# Patient Record
Sex: Male | Born: 1950 | Race: White | Hispanic: No | Marital: Married | Smoking: Current every day smoker
Health system: Southern US, Community
[De-identification: ages and names within clinical notes are randomized; demographics above are authoritative.]

## PROBLEM LIST (undated history)

## (undated) DIAGNOSIS — N200 Calculus of kidney: Secondary | ICD-10-CM

## (undated) DIAGNOSIS — K56609 Unspecified intestinal obstruction, unspecified as to partial versus complete obstruction: Secondary | ICD-10-CM

## (undated) DIAGNOSIS — K219 Gastro-esophageal reflux disease without esophagitis: Secondary | ICD-10-CM

## (undated) DIAGNOSIS — R519 Headache, unspecified: Secondary | ICD-10-CM

## (undated) DIAGNOSIS — R51 Headache: Secondary | ICD-10-CM

## (undated) DIAGNOSIS — M1990 Unspecified osteoarthritis, unspecified site: Secondary | ICD-10-CM

## (undated) DIAGNOSIS — R0600 Dyspnea, unspecified: Secondary | ICD-10-CM

## (undated) DIAGNOSIS — J449 Chronic obstructive pulmonary disease, unspecified: Secondary | ICD-10-CM

## (undated) DIAGNOSIS — J189 Pneumonia, unspecified organism: Secondary | ICD-10-CM

## (undated) DIAGNOSIS — I10 Essential (primary) hypertension: Secondary | ICD-10-CM

## (undated) DIAGNOSIS — G8929 Other chronic pain: Secondary | ICD-10-CM

## (undated) HISTORY — DX: Gastro-esophageal reflux disease without esophagitis: K21.9

## (undated) HISTORY — PX: BACK SURGERY: SHX140

## (undated) HISTORY — DX: Calculus of kidney: N20.0

## (undated) HISTORY — DX: Headache, unspecified: R51.9

## (undated) HISTORY — DX: Other chronic pain: G89.29

## (undated) HISTORY — DX: Essential (primary) hypertension: I10

## (undated) HISTORY — DX: Unspecified osteoarthritis, unspecified site: M19.90

## (undated) HISTORY — DX: Pneumonia, unspecified organism: J18.9

## (undated) HISTORY — PX: ROTATOR CUFF REPAIR: SHX139

## (undated) HISTORY — DX: Unspecified intestinal obstruction, unspecified as to partial versus complete obstruction: K56.609

## (undated) HISTORY — DX: Headache: R51

## (undated) HISTORY — PX: KNEE SURGERY: SHX244

## (undated) HISTORY — PX: OTHER SURGICAL HISTORY: SHX169

---

## 1989-05-23 HISTORY — PX: OTHER SURGICAL HISTORY: SHX169

## 2004-05-23 HISTORY — PX: HERNIA REPAIR: SHX51

## 2016-09-20 HISTORY — PX: APPENDECTOMY: SHX54

## 2017-06-08 ENCOUNTER — Encounter: Payer: Self-pay | Admitting: Internal Medicine

## 2017-06-08 ENCOUNTER — Ambulatory Visit: Payer: Medicare Other | Admitting: Internal Medicine

## 2017-06-08 DIAGNOSIS — F17210 Nicotine dependence, cigarettes, uncomplicated: Secondary | ICD-10-CM | POA: Diagnosis not present

## 2017-06-08 DIAGNOSIS — R0789 Other chest pain: Secondary | ICD-10-CM

## 2017-06-08 DIAGNOSIS — R0600 Dyspnea, unspecified: Secondary | ICD-10-CM | POA: Insufficient documentation

## 2017-06-08 DIAGNOSIS — J449 Chronic obstructive pulmonary disease, unspecified: Secondary | ICD-10-CM | POA: Diagnosis not present

## 2017-06-08 DIAGNOSIS — R0609 Other forms of dyspnea: Secondary | ICD-10-CM | POA: Diagnosis not present

## 2017-06-08 MED ORDER — TIOTROPIUM BROMIDE-OLODATEROL 2.5-2.5 MCG/ACT IN AERS: 2.0000 | INHALATION_SPRAY | Freq: Every day | RESPIRATORY_TRACT | 11 refills | Status: DC

## 2017-06-08 MED ORDER — TIOTROPIUM BROMIDE-OLODATEROL 2.5-2.5 MCG/ACT IN AERS: 2.0000 | INHALATION_SPRAY | Freq: Every day | RESPIRATORY_TRACT | 0 refills | Status: DC

## 2017-06-08 NOTE — Patient Instructions (Addendum)
Stiolto 2 pffs each am   Treatment consists of avoiding foods that cause gas (especially boiled eggs, mexcican food but especially  beans and undercooked vegetables like  spinach and some salads)  and citrucel 1 heaping tsp twice daily with a large glass of water.  Pain should improve w/in 2 weeks and if not then consider further GI work up.     GERD (REFLUX)  is an extremely common cause of respiratory symptoms just like yours , many times with no obvious heartburn at all.    It can be treated with medication, but also with lifestyle changes including elevation of the head of your bed (ideally with 6 inch  bed blocks),  Smoking cessation, avoidance of late meals, excessive alcohol, and avoid fatty foods, chocolate, peppermint, colas, red wine, and acidic juices such as orange juice.  NO MINT OR MENTHOL PRODUCTS SO NO COUGH DROPS  USE SUGARLESS CANDY INSTEAD (Jolley ranchers or Stover's or Life Savers) or even ice chips will also do - the key is to swallow to prevent all throat clearing. NO OIL BASED VITAMINS - use powdered substitutes.    Zostrix cream four times daily but especially at bedtime   The key is to stop smoking completely before smoking completely stops you!   Please schedule a follow up office visit in 4 weeks, sooner if needed with pfts  - needs alpha one screen on return
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
## 2017-06-09 ENCOUNTER — Encounter: Payer: Self-pay | Admitting: Internal Medicine

## 2017-06-09 DIAGNOSIS — R0789 Other chest pain: Secondary | ICD-10-CM | POA: Insufficient documentation

## 2017-06-09 DIAGNOSIS — F17210 Nicotine dependence, cigarettes, uncomplicated: Secondary | ICD-10-CM | POA: Insufficient documentation

## 2017-06-09 NOTE — Assessment & Plan Note (Signed)
Chronic longstanding refractory LUQ pain c/w IBS with post thoracotomy pain on L as well   rec diet/ zostrix/ citrucel trial  - see avs for instructions unique to this ov

## 2017-06-09 NOTE — Assessment & Plan Note (Signed)
06/08/2017  Walked RA x 3 laps @ 185 ft each stopped due to  End of study, fast pace, no desat   / min sob   See copd

## 2017-06-09 NOTE — Assessment & Plan Note (Signed)
Spirometry 06/08/2017  FEV1 1.43 (36%)  Ratio 41 with classic curvature   - 06/08/2017  After extensive coaching inhaler device  effectiveness =    90% try stiolto sample   DDX of  difficult airways management almost all start with A and  include Adherence, Ace Inhibitors, Acid Reflux, Active Sinus Disease, Alpha 1 Antitripsin deficiency, Anxiety masquerading as Airways dz,  ABPA,  Allergy(esp in young), Aspiration (esp in elderly), Adverse effects of meds,  Active smokers, A bunch of PE's (a small clot burden can't cause this syndrome unless there is already severe underlying pulm or vascular dz with poor reserve) plus two Bs  = Bronchiectasis and Beta blocker use..and one C= CHF  Adherence is always the initial "prime suspect" and is a multilayered concern that requires a "trust but verify" approach in every patient - starting with knowing how to use medications, especially inhalers, correctly, keeping up with refills and understanding the fundamental difference between maintenance and prns vs those medications only taken for a very short course and then stopped and not refilled.  - see hfa teaching - return with all meds in hand using a trust but verify approach to confirm accurate Medication  Reconciliation The principal here is that until we are certain that the  patients are doing what we've asked, it makes no sense to ask them to do more.   Active smoking  (see separate a/p)   ? Anxiety > usually at the bottom of this list of usual suspects but should be much higher on this pt's based on H and P based on multiple refractory chronic pain locations and reported doe x 5 ft not reproducible here but anxiety can certainly add to air trapping if breathing rate goes up excessively and this may be happening here.   ? Alpha one def > check at next ov   Total time devoted to counseling  > 50 % of initial 60 min office visit:  review case with pt/ discussion of options/alternatives/ personally creating  written customized instructions  in presence of pt  then going over those specific  Instructions directly with the pt including how to use all of the meds but in particular covering each new medication in detail and the difference between the maintenance= "automatic" meds and the prns using an action plan format for the latter (If this problem/symptom => do that organization reading Left to right).  Please see AVS from this visit for a full list of these instructions which I personally wrote for this pt and  are unique to this visit.

## 2017-06-09 NOTE — Assessment & Plan Note (Signed)

## 2017-06-28 ENCOUNTER — Encounter: Payer: Self-pay | Admitting: Internal Medicine

## 2017-06-28 ENCOUNTER — Ambulatory Visit: Payer: Medicare Other | Admitting: Internal Medicine

## 2017-06-28 VITALS — BP 136/88 | HR 79 | Temp 97.9°F | Ht 74.0 in | Wt 188.0 lb

## 2017-06-28 DIAGNOSIS — R058 Other specified cough: Secondary | ICD-10-CM

## 2017-06-28 DIAGNOSIS — F17210 Nicotine dependence, cigarettes, uncomplicated: Secondary | ICD-10-CM | POA: Diagnosis not present

## 2017-06-28 DIAGNOSIS — J449 Chronic obstructive pulmonary disease, unspecified: Secondary | ICD-10-CM | POA: Diagnosis not present

## 2017-06-28 DIAGNOSIS — R05 Cough: Secondary | ICD-10-CM | POA: Diagnosis not present

## 2017-06-28 MED ORDER — FAMOTIDINE 20 MG PO TABS
ORAL_TABLET | ORAL | 2 refills | Status: DC
Start: 2017-06-28 — End: 2017-09-26

## 2017-06-28 MED ORDER — PREDNISONE 10 MG PO TABS: ORAL_TABLET | ORAL | 0 refills | Status: DC

## 2017-06-28 MED ORDER — PANTOPRAZOLE SODIUM 40 MG PO TBEC: 40.0000 mg | DELAYED_RELEASE_TABLET | Freq: Every day | ORAL | 2 refills | Status: DC

## 2017-06-28 MED ORDER — TRAMADOL HCL 50 MG PO TABS: ORAL_TABLET | ORAL | 0 refills | Status: DC

## 2017-06-28 MED ORDER — MOMETASONE FURO-FORMOTEROL FUM 200-5 MCG/ACT IN AERO: 2.0000 | INHALATION_SPRAY | Freq: Two times a day (BID) | RESPIRATORY_TRACT | 0 refills | Status: DC
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
## 2017-06-28 NOTE — Patient Instructions (Addendum)
Pantoprazole (protonix) 40 mg   Take  30-60 min before first meal of the day and Pepcid (famotidine)  20 mg one hour before    bedtime until return to office - this is the best way to tell whether stomach acid is contributing to your problem.     GERD (REFLUX)  is an extremely common cause of respiratory symptoms just like yours , many times with no obvious heartburn at all.    It can be treated with medication, but also with lifestyle changes including elevation of the head of your bed (ideally with 6 inch  bed blocks),  Smoking cessation, avoidance of late meals, excessive alcohol, and avoid fatty foods, chocolate, peppermint, colas, red wine, and acidic juices such as orange juice.  NO MINT OR MENTHOL PRODUCTS SO NO COUGH DROPS   USE SUGARLESS CANDY INSTEAD (Jolley ranchers or Stover's or Life Savers) or even ice chips will also do - the key is to swallow to prevent all throat clearing. NO OIL BASED VITAMINS - use powdered substitutes.    Prednisone 10 mg 2 daily until better then 1 daily until seen     Best cough medication > mucinex dm 1200 mg every 12 hours and supplement tramadol 50 mg every 4 hours as needed    Keep your appt for next week  - late add duler 200 Take 2 puffs first thing in am and then another 2 puffs about 12 hours later.  - alpha one screen next ov

## 2017-06-29 ENCOUNTER — Encounter: Payer: Self-pay | Admitting: Internal Medicine

## 2017-06-29 DIAGNOSIS — R05 Cough: Secondary | ICD-10-CM | POA: Insufficient documentation

## 2017-06-29 DIAGNOSIS — R058 Other specified cough: Secondary | ICD-10-CM | POA: Insufficient documentation

## 2017-06-29 NOTE — Assessment & Plan Note (Addendum)
Of the three most common causes of  Sub-acute or recurrent or chronic cough, only one (GERD)  can actually contribute to/ trigger  the other two (asthma and post nasal drip syndrome)  and perpetuate the cylce of cough.  While not intuitively obvious, many patients with chronic low grade reflux do not cough until there is a primary insult that disturbs the protective epithelial barrier and exposes sensitive nerve endings.   This is typically viral but can be direct physical injury such as with an endotracheal tube.     The point is that once this occurs, it is difficult to eliminate the cycle  using anything but a maximally effective acid suppression regimen at least in the short run, accompanied by an appropriate diet to address non acid GERD and control / eliminate the cough itself for at least 3 days with tramadol. See avs for instructions unique to this ov

## 2017-06-29 NOTE — Assessment & Plan Note (Signed)
>   3 min Discussed the risks and costs (both direct and indirect)  of smoking relative to the benefits of quitting but patient unwilling to commit at this point to a specific quit date.       

## 2017-06-29 NOTE — Assessment & Plan Note (Signed)
Spirometry 06/08/2017  FEV1 1.43 (36%)  Ratio 41 with classic curvature   - 06/08/2017   stiolto sample > did not tol due to muscle cramps in neck and shoulders - 06/28/2017  After extensive coaching inhaler device  effectiveness =    90% > try dulera 200 2bid and pred 20 mg daily until better then 10 mg daily   Severe copd and still smoking is a challenging combination - see sep a/p   Will try dulera 200 and restart pred as much worse since stopped. The goal with a chronic steroid dependent illness is always arriving at the lowest effective dose that controls the disease/symptoms and not accepting a set "formula" which is based on statistics or guidelines that don't always take into account patient  variability or the natural hx of the dz in every individual patient, which may well vary over time.  For now therefore I recommend the patient maintain  20 mg as ceiling and 10 mg as floor for now   I had an extended discussion with the patient reviewing all relevant studies completed to date and  lasting 15 to 20 minutes of a 25 minute acute office visit    Each maintenance medication was reviewed in detail including most importantly the difference between maintenance and prns and under what circumstances the prns are to be triggered using an action plan format that is not reflected in the computer generated alphabetically organized AVS.    Please see AVS for specific instructions unique to this visit that I personally wrote and verbalized to the the pt in detail and then reviewed with pt  by my nurse highlighting any  changes in therapy recommended at today's visit to their plan of care.

## 2017-07-07 ENCOUNTER — Ambulatory Visit (INDEPENDENT_AMBULATORY_CARE_PROVIDER_SITE_OTHER): Payer: Medicare Other | Admitting: Internal Medicine

## 2017-07-07 ENCOUNTER — Other Ambulatory Visit (INDEPENDENT_AMBULATORY_CARE_PROVIDER_SITE_OTHER): Payer: Medicare Other

## 2017-07-07 ENCOUNTER — Telehealth: Payer: Self-pay | Admitting: Internal Medicine

## 2017-07-07 ENCOUNTER — Ambulatory Visit: Payer: Medicare Other | Admitting: Internal Medicine

## 2017-07-07 ENCOUNTER — Encounter: Payer: Self-pay | Admitting: Internal Medicine

## 2017-07-07 VITALS — BP 128/88 | HR 85 | Ht 73.0 in | Wt 188.0 lb

## 2017-07-07 DIAGNOSIS — J449 Chronic obstructive pulmonary disease, unspecified: Secondary | ICD-10-CM | POA: Diagnosis not present

## 2017-07-07 DIAGNOSIS — F17210 Nicotine dependence, cigarettes, uncomplicated: Secondary | ICD-10-CM

## 2017-07-07 LAB — PULMONARY FUNCTION TEST
DL/VA % pred: 37 %
DL/VA: 1.78 ml/min/mmHg/L
DLCO UNC % PRED: 34 %
DLCO unc: 12.46 ml/min/mmHg
FEF 25-75 PRE: 0.62 L/s
FEF 25-75 Post: 0.68 L/sec
FEF2575-%CHANGE-POST: 9 %
FEF2575-%PRED-PRE: 20 %
FEF2575-%Pred-Post: 22 %
FEV1-%Change-Post: 5 %
FEV1-%Pred-Post: 44 %
FEV1-%Pred-Pre: 42 %
FEV1-POST: 1.7 L
FEV1-Pre: 1.61 L
FEV1FVC-%Change-Post: 2 %
FEV1FVC-%Pred-Pre: 54 %
FEV6-%CHANGE-POST: 4 %
FEV6-%PRED-PRE: 76 %
FEV6-%Pred-Post: 79 %
FEV6-POST: 3.85 L
FEV6-PRE: 3.7 L
FEV6FVC-%CHANGE-POST: 1 %
FEV6FVC-%PRED-PRE: 99 %
FEV6FVC-%Pred-Post: 100 %
FVC-%CHANGE-POST: 2 %
FVC-%Pred-Post: 78 %
FVC-%Pred-Pre: 76 %
FVC-Post: 4.03 L
FVC-Pre: 3.92 L
POST FEV6/FVC RATIO: 96 %
PRE FEV1/FVC RATIO: 41 %
Post FEV1/FVC ratio: 42 %
Pre FEV6/FVC Ratio: 94 %
RV % PRED: 277 %
RV: 7 L
TLC % PRED: 152 %
TLC: 11.61 L

## 2017-07-07 LAB — CBC WITH DIFFERENTIAL/PLATELET
BASOS ABS: 0.1 10*3/uL (ref 0.0–0.1)
Basophils Relative: 0.4 % (ref 0.0–3.0)
Eosinophils Absolute: 0 10*3/uL (ref 0.0–0.7)
Eosinophils Relative: 0.3 % (ref 0.0–5.0)
HCT: 46.2 % (ref 39.0–52.0)
HEMOGLOBIN: 15 g/dL (ref 13.0–17.0)
LYMPHS ABS: 2 10*3/uL (ref 0.7–4.0)
Lymphocytes Relative: 12.4 % (ref 12.0–46.0)
MCHC: 32.5 g/dL (ref 30.0–36.0)
MONO ABS: 0.7 10*3/uL (ref 0.1–1.0)
MONOS PCT: 4.3 % (ref 3.0–12.0)
NEUTROS PCT: 82.6 % — AB (ref 43.0–77.0)
Neutro Abs: 13.1 10*3/uL — ABNORMAL HIGH (ref 1.4–7.7)
Platelets: 498 10*3/uL — ABNORMAL HIGH (ref 150.0–400.0)
RBC: 6.87 Mil/uL — AB (ref 4.22–5.81)
RDW: 16 % — ABNORMAL HIGH (ref 11.5–15.5)
WBC: 15.9 10*3/uL — AB (ref 4.0–10.5)

## 2017-07-07 LAB — BASIC METABOLIC PANEL
BUN: 13 mg/dL (ref 6–23)
CO2: 26 meq/L (ref 19–32)
Calcium: 9.1 mg/dL (ref 8.4–10.5)
Chloride: 104 mEq/L (ref 96–112)
Creatinine, Ser: 0.82 mg/dL (ref 0.40–1.50)
GFR: 100.05 mL/min (ref >60.00)
Glucose, Bld: 113 mg/dL — ABNORMAL HIGH (ref 70–99)
Potassium: 4.2 mEq/L (ref 3.5–5.1)
SODIUM: 139 meq/L (ref 135–145)

## 2017-07-07 MED ORDER — ALBUTEROL SULFATE HFA 108 (90 BASE) MCG/ACT IN AERS: INHALATION_SPRAY | RESPIRATORY_TRACT | 1 refills | Status: DC

## 2017-07-07 MED ORDER — MOMETASONE FURO-FORMOTEROL FUM 200-5 MCG/ACT IN AERO: 2.0000 | INHALATION_SPRAY | Freq: Two times a day (BID) | RESPIRATORY_TRACT | 11 refills | Status: DC

## 2017-07-07 MED ORDER — MOMETASONE FURO-FORMOTEROL FUM 200-5 MCG/ACT IN AERO: 2.0000 | INHALATION_SPRAY | Freq: Two times a day (BID) | RESPIRATORY_TRACT | 0 refills | Status: DC

## 2017-07-07 NOTE — Telephone Encounter (Signed)
Patient had questions about rescue inhaler. Questions answered. Nothing further needed.
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
## 2017-07-07 NOTE — Progress Notes (Signed)
PFT done today. 

## 2017-07-07 NOTE — Patient Instructions (Addendum)
Prednisone 10 mg one half daily  X  A week then one half even days for a week then stop - resume the previous dose worse   Continue dulera 200  Take 2 puffs first thing in am and then another 2 puffs about 12 hours later.   Only use your albuterol (proair)  as a rescue medication to be used if you can't catch your breath by resting or doing a relaxed purse lip breathing pattern.  - The less you use it, the better it will work when you need it. - Ok to use up to 2 puffs  every 4 hours if you must but call for immediate appointment if use goes up over your usual need - Don't leave home without it !!  (think of it like the spare tire for your car)   Please remember to go to the lab department downstairs in the basement  for your tests - we will call you with the results when they are available.      Please schedule a follow up office visit in 6 weeks, call sooner if needed

## 2017-07-08 ENCOUNTER — Encounter: Payer: Self-pay | Admitting: Internal Medicine

## 2017-07-08 NOTE — Assessment & Plan Note (Signed)
>   3 min  I took an extended  opportunity with this patient to outline the consequences of continued cigarette use  in airway disorders based on all the data we have from the multiple national lung health studies (perfomed over decades at millions of dollars in cost)  indicating that smoking cessation, not choice of inhalers or physicians, is the most important aspect of his care.  Not willing to set quit date, wife also active smoker so these are definitely challenges.

## 2017-07-08 NOTE — Assessment & Plan Note (Signed)
Spirometry 06/08/2017  FEV1 1.43 (36%)  Ratio 41 with classic curvature   - 06/08/2017   stiolto sample > did not tol due to muscle cramps in neck and shoulders - 06/28/2017  After extensive coaching inhaler device  effectiveness =    90% > try dulera 200 2bid and pred 20 mg daily until better then 10 mg daily > much better 07/07/2017   - PFT's  07/07/2017  FEV1 1.70 (44 % ) ratio 42  p 5 % improvement from saba p dulera 200 prior to study with DLCO  34/37 % corrects to 92  % for alv volume   - 07/07/2017 taper off pred x 2 weeks   Despite active smoking he's improved on dulera where did not improve on stiolto so rec continue dulera 200 2bid and taper pred  The goal with a chronic steroid dependent illness is always arriving at the lowest effective dose that controls the disease/symptoms and not accepting a set "formula" which is based on statistics or guidelines that don't always take into account patient  variability or the natural hx of the dz in every individual patient, which may well vary over time.  For now therefore I recommend the patient taper off pred x 2 weeks but if flares go back to previous dose that worked   I had an extended discussion with the patient and wife reviewing all relevant studies completed to date and  lasting 15 to 20 minutes of a 25 minute visit    Each maintenance medication was reviewed in detail including most importantly the difference between maintenance and prns and under what circumstances the prns are to be triggered using an action plan format that is not reflected in the computer generated alphabetically organized AVS.    Please see AVS for specific instructions unique to this visit that I personally wrote and verbalized to the the pt in detail and then reviewed with pt  by my nurse highlighting any  changes in therapy recommended at today's visit to their plan of care.

## 2017-07-12 ENCOUNTER — Telehealth: Payer: Self-pay | Admitting: Internal Medicine

## 2017-07-12 LAB — ALPHA-1 ANTITRYPSIN PHENOTYPE: A1 ANTITRYPSIN SER: 98 mg/dL (ref 83–199)

## 2017-07-12 NOTE — Telephone Encounter (Signed)
Called and spoke with pt letting him know results of his labwork. Pt expressed understanding. Nothing further needed at this current time.

## 2017-07-12 NOTE — Progress Notes (Signed)
LMTCB

## 2017-08-24 ENCOUNTER — Ambulatory Visit: Payer: Medicare Other | Admitting: Internal Medicine

## 2017-08-24 ENCOUNTER — Encounter: Payer: Self-pay | Admitting: Internal Medicine

## 2017-08-24 VITALS — BP 136/78 | HR 80 | Ht 73.0 in | Wt 189.0 lb

## 2017-08-24 DIAGNOSIS — J449 Chronic obstructive pulmonary disease, unspecified: Secondary | ICD-10-CM

## 2017-08-24 DIAGNOSIS — F17210 Nicotine dependence, cigarettes, uncomplicated: Secondary | ICD-10-CM

## 2017-08-24 MED ORDER — TIOTROPIUM BROMIDE MONOHYDRATE 2.5 MCG/ACT IN AERS: INHALATION_SPRAY | RESPIRATORY_TRACT | 11 refills | Status: DC

## 2017-08-24 MED ORDER — PREDNISONE 10 MG PO TABS: ORAL_TABLET | ORAL | 0 refills | Status: DC

## 2017-08-24 NOTE — Patient Instructions (Addendum)
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
## 2017-08-28 ENCOUNTER — Encounter: Payer: Self-pay | Admitting: Internal Medicine

## 2017-08-28 NOTE — Assessment & Plan Note (Signed)

## 2017-08-28 NOTE — Assessment & Plan Note (Signed)
Spirometry 06/08/2017  FEV1 1.43 (36%)  Ratio 41 with classic curvature   - 06/08/2017   stiolto sample > did not tol due to muscle cramps in neck and shoulders - 06/28/2017  After extensive coaching inhaler device  effectiveness =    90% > try dulera 200 2bid and pred 20 mg daily until better then 10 mg daily > much better 07/07/2017  - PFT's  07/07/2017  FEV1 1.70 (44 % ) ratio 42  p 5 % improvement from saba p dulera 200 prior to study with DLCO  34/37 % corrects to 92  % for alv volume   - 07/07/2017 taper off pred x 2 weeks  - alpha one screening 07/07/2017  >  MZ level 98   - 08/24/2017  After extensive coaching inhaler device  effectiveness =    90% with SMI > try add spiriva 2 respimat each am    I had an extended discussion with the patient reviewing all relevant studies completed to date and  lasting 15 to 20 minutes of a 25 minute visit on the following ongoing concerns:   1) reviewed MZ and why he should never smoke again and his sibling should be checked as well as his children  2)   Group D in terms of symptom/risk and laba/lama/ICS  therefore appropriate rx at this point. So Added spriva trial and pred taper to off if tol  3)  Each maintenance medication was reviewed in detail including most importantly the difference between maintenance and as needed and under what circumstances the prns are to be used.  Please see AVS for specific  Instructions which are unique to this visit and I personally typed out  which were reviewed in detail in writing with the patient and a copy provided.

## 2017-09-26 ENCOUNTER — Other Ambulatory Visit: Payer: Self-pay | Admitting: Internal Medicine

## 2017-10-06 ENCOUNTER — Ambulatory Visit: Payer: Medicare Other | Admitting: Internal Medicine

## 2017-10-09 ENCOUNTER — Ambulatory Visit: Payer: Medicare Other | Admitting: Internal Medicine

## 2017-10-09 ENCOUNTER — Encounter: Payer: Self-pay | Admitting: Internal Medicine

## 2017-10-09 VITALS — BP 140/86 | HR 83 | Ht 73.0 in | Wt 187.4 lb

## 2017-10-09 DIAGNOSIS — F17210 Nicotine dependence, cigarettes, uncomplicated: Secondary | ICD-10-CM

## 2017-10-09 DIAGNOSIS — R059 Cough, unspecified: Secondary | ICD-10-CM

## 2017-10-09 DIAGNOSIS — J449 Chronic obstructive pulmonary disease, unspecified: Secondary | ICD-10-CM | POA: Diagnosis not present

## 2017-10-09 DIAGNOSIS — R05 Cough: Secondary | ICD-10-CM | POA: Diagnosis not present

## 2017-10-09 DIAGNOSIS — R058 Other specified cough: Secondary | ICD-10-CM

## 2017-10-09 MED ORDER — MOMETASONE FURO-FORMOTEROL FUM 200-5 MCG/ACT IN AERO: 2.0000 | INHALATION_SPRAY | Freq: Two times a day (BID) | RESPIRATORY_TRACT | 0 refills | Status: DC

## 2017-10-09 MED ORDER — TRAMADOL HCL 50 MG PO TABS: ORAL_TABLET | ORAL | 0 refills | Status: DC
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
## 2017-10-09 NOTE — Patient Instructions (Addendum)
Take delsym two tsp every 12 hours and supplement if needed with  tramadol 50 mg up to 2 every 4 hours to suppress the urge to cough. Swallowing water and/or using ice chips/non mint and menthol containing candies (such as lifesavers or sugarless jolly ranchers) are also effective.  You should rest your voice and avoid activities that you know make you cough.  Once you have eliminated the cough for 3 straight days try reducing the tramadol first,  then the delsym as tolerated.    Prednisone 10 mg take 2 daily with breakfast until completely better (no cough or need for tramadol or albuterol)  then 1 daily x 5 days x one half tablet daily   Please see patient coordinator before you leave today  to schedule sinus CT   Continue dulera 200 Take 2 puffs first thing in am and then another 2 puffs about 12 hours later.   Keep appt to see me on 10/17/17 and be sure to bring all medications/ inhalers

## 2017-10-10 ENCOUNTER — Encounter: Payer: Self-pay | Admitting: Internal Medicine

## 2017-10-10 NOTE — Assessment & Plan Note (Addendum)
Spirometry 06/08/2017  FEV1 1.43 (36%)  Ratio 41 with classic curvature   - 06/08/2017   stiolto sample > did not tol due to muscle cramps in neck and shoulders - 06/28/2017  After extensive coaching inhaler device  effectiveness =    90% > try dulera 200 2bid and pred 20 mg daily until better then 10 mg daily > much better 07/07/2017  - PFT's  07/07/2017  FEV1 1.70 (44 % ) ratio 42  p 5 % improvement from saba p dulera 200 prior to study with DLCO  34/37 % corrects to 92  % for alv volume   - 07/07/2017 taper off pred x 2 weeks  - alpha one screening 07/07/2017  >  MZ level 98   - 08/24/2017  After extensive coaching inhaler device  effectiveness =    90% with SMI > try add spiriva 2 respimat each am    10/09/2017  After extensive coaching inhaler device  effectiveness =    90% > continue dulera hfa   Despite active smoking I believe this problem is actually unchanged and cough is now the main issue and may not be related to copd (see separate a/p)   No change in maint rx needed

## 2017-10-10 NOTE — Assessment & Plan Note (Signed)
I emphasized that although we never turn away smokers from the pulmonary clinic, we do ask that they understand that the recommendations that we make  won't work nearly as well in the presence of continued cigarette exposure.  In fact, we may very well  reach a point where we can't promise to help the patient if he/she can't quit smoking. (We can and will promise to try to help, we just can't promise what we recommend will really work)  

## 2017-10-10 NOTE — Assessment & Plan Note (Addendum)
Sinus CT ordered   DDX of  difficult airways management almost all start with A and  include Adherence, Ace Inhibitors, Acid Reflux, Active Sinus Disease, Alpha 1 Antitripsin deficiency, Anxiety masquerading as Airways dz,  ABPA,  Allergy(esp in young), Aspiration (esp in elderly), Adverse effects of meds,  Active smokers, A bunch of PE's (a small clot burden can't cause this syndrome unless there is already severe underlying pulm or vascular dz with poor reserve) plus two Bs  = Bronchiectasis and Beta blocker use..and one C= CHF   Adherence is always the initial "prime suspect" and is a multilayered concern that requires a "trust but verify" approach in every patient - starting with knowing how to use medications, especially inhalers, correctly, keeping up with refills and understanding the fundamental difference between maintenance and prns vs those medications only taken for a very short course and then stopped and not refilled.  - see hfa teaching - return with all meds in hand using a trust but verify approach to confirm accurate Medication  Reconciliation The principal here is that until we are certain that the  patients are doing what we've asked, it makes no sense to ask them to do more.   Active smoking > top of the usual list of suspects (see separate a/p)    ? Acid (or non-acid) GERD > always difficult to exclude as up to 75% of pts in some series report no assoc GI/ Heartburn symptoms> rec continue max (24h)  acid suppression and diet restrictions/ reviewed    - Of the three most common causes of  Sub-acute / recurrent or chronic cough, only one (GERD)  can actually contribute to/ trigger  the other two (asthma and post nasal drip syndrome)  and perpetuate the cylce of cough.  While not intuitively obvious, many patients with chronic low grade reflux do not cough until there is a primary insult that disturbs the protective epithelial barrier and exposes sensitive nerve endings.   This is  typically viral but can due to PNDS and  either may apply here.   The point is that once this occurs, it is difficult to eliminate the cycle  using anything but a maximally effective acid suppression regimen at least in the short run, accompanied by an appropriate diet to address non acid GERD and control / eliminate the cough itself for at least 3 days with tramadol    ? Allergy component > continue dulera 200 and add prednisone back and titrate down but not off this time to 5 mg floor.   ? Active sinus Dz > check sinus CT  ? A bunch of PE's > excluded by CTa 5/20/ 19    I had an extended discussion with the patient reviewing all relevant studies completed to date and  lasting 25 minutes of a 40  minute extended office  visit addressing severe non-specific but potentially very serious refractory respiratory symptoms of uncertain and potentially multiple  Etiologies.   See device teaching / smoking counseling which extended face to face time for this visit   Each maintenance medication was reviewed in detail including most importantly the difference between maintenance and prns and under what circumstances the prns are to be triggered using an action plan format that is not reflected in the computer generated alphabetically organized AVS.    Please see AVS for specific instructions unique to this office visit that I personally wrote and verbalized to the the pt in detail and then reviewed with pt  by my nurse highlighting any changes in therapy/plan of care  recommended at today's visit.

## 2017-10-12 ENCOUNTER — Telehealth: Payer: Self-pay | Admitting: Internal Medicine

## 2017-10-12 NOTE — Telephone Encounter (Signed)
PA initiated on [email or web address] today.  Key: [id number] for Dulera 200 It takes 7-10 business days for decision of approval or denial.

## 2017-10-17 ENCOUNTER — Ambulatory Visit (INDEPENDENT_AMBULATORY_CARE_PROVIDER_SITE_OTHER)
Admission: RE | Admit: 2017-10-17 | Discharge: 2017-10-17 | Disposition: A | Discharge status: Home / Self Care | Payer: Medicare Other | Source: Ambulatory Visit | Attending: Internal Medicine | Admitting: Internal Medicine

## 2017-10-17 ENCOUNTER — Encounter: Payer: Self-pay | Admitting: Internal Medicine

## 2017-10-17 ENCOUNTER — Ambulatory Visit: Payer: Medicare Other | Admitting: Internal Medicine

## 2017-10-17 VITALS — BP 152/76 | HR 65 | Ht 74.0 in | Wt 187.4 lb

## 2017-10-17 DIAGNOSIS — R058 Other specified cough: Secondary | ICD-10-CM

## 2017-10-17 DIAGNOSIS — J449 Chronic obstructive pulmonary disease, unspecified: Secondary | ICD-10-CM

## 2017-10-17 DIAGNOSIS — R059 Cough, unspecified: Secondary | ICD-10-CM

## 2017-10-17 DIAGNOSIS — R05 Cough: Secondary | ICD-10-CM

## 2017-10-17 MED ORDER — AMOXICILLIN-POT CLAVULANATE 875-125 MG PO TABS: 1.0000 | ORAL_TABLET | Freq: Two times a day (BID) | ORAL | 0 refills | Status: DC

## 2017-10-17 MED ORDER — AMOXICILLIN-POT CLAVULANATE 875-125 MG PO TABS: ORAL_TABLET | ORAL | 0 refills | Status: DC
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
## 2017-10-17 NOTE — Patient Instructions (Addendum)
Prednisone 10 mg 2 each until 100% better then 1 daily one week then one-half daily until return and if flare go back one step    Augmentin 875 mg take one pill twice daily  X 20 days - take at breakfast and supper with large glass of water.  It would help reduce the usual side effects (diarrhea and yeast infections) if you ate cultured yogurt at lunch.    Please see patient coordinator before you leave today  to schedule sinus CT in 21 days   Please schedule a follow up visit in 3 weeks  but call sooner if needed  with all medications /inhalers/ solutions in hand so we can verify exactly what you are taking. This includes all medications from all doctors and over the counters

## 2017-10-18 ENCOUNTER — Encounter: Payer: Self-pay | Admitting: Internal Medicine

## 2017-10-18 NOTE — Assessment & Plan Note (Addendum)
Spirometry 06/08/2017  FEV1 1.43 (36%)  Ratio 41 with classic curvature   - 06/08/2017   stiolto sample > did not tol due to muscle cramps in neck and shoulders - 06/28/2017  After extensive coaching inhaler device  effectiveness =    90% > try dulera 200 2bid and pred 20 mg daily until better then 10 mg daily > much better 07/07/2017  - PFT's  07/07/2017  FEV1 1.70 (44 % ) ratio 42  p 5 % improvement from saba p dulera 200 prior to study with DLCO  34/37 % corrects to 92  % for alv volume   - 07/07/2017 taper off pred x 2 weeks  - alpha one screening 07/07/2017  >  MZ level 98   - 08/24/2017  After extensive coaching inhaler device  effectiveness =    90% with SMI > try add spiriva 2 respimat each am   Improved but still on prednisone.  The goal with a chronic steroid dependent illness is always arriving at the lowest effective dose that controls the disease/symptoms and not accepting a set "formula" which is based on statistics or guidelines that don't always take into account patient  variability or the natural hx of the dz in every individual patient, which may well vary over time.  For now therefore I recommend the patient maintain  A floor of 5 mg per day for now and a ceiling of 20 mg daily    - The proper method of use, as well as anticipated side effects, of a metered-dose inhaler are discussed and demonstrated to the patient.     I had an extended discussion with the patient reviewing all relevant studies completed to date and  lasting 15 to 20 minutes of a 25 minute visit    See device teaching which extended face to face time for this visit.  Each maintenance medication was reviewed in detail including emphasizing most importantly the difference between maintenance and prns and under what circumstances the prns are to be triggered using an action plan format that is not reflected in the computer generated alphabetically organized AVS which I have not found useful in most complex patients,  especially with respiratory illnesses  Please see AVS for specific instructions unique to this visit that I personally wrote and verbalized to the the pt in detail and then reviewed with pt  by my nurse highlighting any  changes in therapy recommended at today's visit to their plan of care.

## 2017-10-18 NOTE — Assessment & Plan Note (Signed)
Sinus CT 10/17/2017 >>>  Acute L max sinusitis > augmentin x 20 days then ov 21 days with sinus CT   If truly has quit smoking then this is the main culprit driving his unstable symptoms so rx x 20 days and f/u with sinus ct to see if ent eval needed.

## 2017-10-25 ENCOUNTER — Telehealth: Payer: Self-pay | Admitting: Internal Medicine

## 2017-10-25 NOTE — Telephone Encounter (Signed)
Called and spoke with Pharmacists.  Pharmacy just wanted [place] to know that they sent over a PA for Patients Dulera.

## 2017-11-02 ENCOUNTER — Other Ambulatory Visit: Payer: Self-pay | Admitting: Internal Medicine

## 2017-11-02 MED ORDER — TRAMADOL HCL 50 MG PO TABS: ORAL_TABLET | ORAL | 0 refills | Status: DC

## 2017-11-13 ENCOUNTER — Ambulatory Visit (INDEPENDENT_AMBULATORY_CARE_PROVIDER_SITE_OTHER)
Admission: RE | Admit: 2017-11-13 | Discharge: 2017-11-13 | Disposition: A | Discharge status: Home / Self Care | Payer: Medicare Other | Source: Ambulatory Visit | Attending: Internal Medicine | Admitting: Internal Medicine

## 2017-11-13 ENCOUNTER — Encounter: Payer: Self-pay | Admitting: Internal Medicine

## 2017-11-13 ENCOUNTER — Ambulatory Visit: Payer: Medicare Other | Admitting: Internal Medicine

## 2017-11-13 VITALS — BP 144/80 | HR 90 | Ht 74.0 in | Wt 189.0 lb

## 2017-11-13 DIAGNOSIS — J449 Chronic obstructive pulmonary disease, unspecified: Secondary | ICD-10-CM

## 2017-11-13 DIAGNOSIS — R05 Cough: Secondary | ICD-10-CM | POA: Diagnosis not present

## 2017-11-13 DIAGNOSIS — F17210 Nicotine dependence, cigarettes, uncomplicated: Secondary | ICD-10-CM | POA: Diagnosis not present

## 2017-11-13 DIAGNOSIS — R058 Other specified cough: Secondary | ICD-10-CM

## 2017-11-13 DIAGNOSIS — R059 Cough, unspecified: Secondary | ICD-10-CM

## 2017-11-13 MED ORDER — TIOTROPIUM BROMIDE-OLODATEROL 2.5-2.5 MCG/ACT IN AERS: 2.0000 | INHALATION_SPRAY | Freq: Every day | RESPIRATORY_TRACT | 11 refills | Status: DC

## 2017-11-13 MED ORDER — GABAPENTIN 100 MG PO CAPS: 100.0000 mg | ORAL_CAPSULE | Freq: Three times a day (TID) | ORAL | 2 refills | Status: DC
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
## 2017-11-13 NOTE — Patient Instructions (Addendum)
Gabapentin 100 mg three times a day - called in already   Drop to  prednisone 10 mg daily   When you use up spiriva, stop dulera and spiriva and start stiolto two puffs each am   For drainage / throat tickle try take CHLORPHENIRAMINE  4 mg - take one every 4 hours as needed - available over the counter- may cause drowsiness so start with just a bedtime dose or two and see how you tolerate it before trying in daytime     Please remember to go to the  x-ray department downstairs in the basement  for your tests - we will call you with the results when they are available.      Please schedule a follow up office visit in 6 weeks, call sooner if needed

## 2017-11-14 ENCOUNTER — Encounter: Payer: Self-pay | Admitting: Internal Medicine

## 2017-11-14 NOTE — Assessment & Plan Note (Addendum)
Sinus CT 10/17/2017 >>>  Acute L max sinusitis > augmentin x 20 days then ov 21 days with sinus CT > resolved but still coughing - gabapentin 100  Mg tid trial  Plus prn  1st gen H1 blockers per guidelines    Upper airway cough syndrome (previously labeled PNDS),  is so named because it's frequently impossible to sort out how much is  CR/sinusitis with freq throat clearing (which can be related to primary GERD)   vs  causing  secondary (" extra esophageal")  GERD from wide swings in gastric pressure that occur with throat clearing, often  promoting self use of mint and menthol lozenges that reduce the lower esophageal sphincter tone and exacerbate the problem further in a cyclical fashion.   These are the same pts (now being labeled as having "irritable larynx syndrome" by some cough centers) who not infrequently have a history of having failed to tolerate ace inhibitors,  dry powder inhalers or biphosphonates or report having atypical/extraesophageal reflux symptoms that don't respond to standard doses of PPI  and are easily confused as having aecopd or asthma flares by even experienced allergists/ pulmonologists (myself included).   rec trial of gabapentin/chlorpheniramine  and if not effective referral to ent/ [facility].

## 2017-11-14 NOTE — Assessment & Plan Note (Addendum)
Spirometry 06/08/2017  FEV1 1.43 (36%)  Ratio 41 with classic curvature   - 06/08/2017   stiolto sample > did not tol due to muscle cramps in neck and shoulders - 06/28/2017  After extensive coaching inhaler device  effectiveness =    90% > try dulera 200 2bid and pred 20 mg daily until better then 10 mg daily > much better 07/07/2017  - PFT's  07/07/2017  FEV1 1.70 (44 % ) ratio 42  p 5 % improvement from saba p dulera 200 prior to study with DLCO  34/37 % corrects to 92  % for alv volume   - 07/07/2017 taper off pred x 2 weeks  - alpha one screening 07/07/2017  >  MZ level 98   - 08/24/2017  After extensive coaching inhaler device  effectiveness =    90% with SMI > try add spiriva 2 respimat each am   - 11/13/2017 rec change to stiolto from dulera/spiriva due to cost since already getting steroids systemically and can't afford the dulera (ICS) at this point  Improved overall x for the upper airway cough so rec prednisone taper to 10 mg / day and focus on stopping smoking (see separate a/p)

## 2017-11-15 ENCOUNTER — Telehealth: Payer: Self-pay | Admitting: Internal Medicine

## 2017-11-15 NOTE — Telephone Encounter (Signed)
Notes recorded by Nyoka Cowden, MD on 11/14/2017 at 7:49 AM EDT Call pt: Reviewed cxr and no acute change so no change in recommendations made at ov ----------------------------------- Spoke with pt. He is aware of results. Nothing further was needed.

## 2017-11-15 NOTE — Progress Notes (Signed)
Left detailed msg on machine ok per DPR

## 2017-12-07 ENCOUNTER — Encounter: Payer: Self-pay | Admitting: Gastroenterology

## 2017-12-25 ENCOUNTER — Encounter: Payer: Self-pay | Admitting: Internal Medicine

## 2017-12-25 ENCOUNTER — Ambulatory Visit: Payer: Medicare Other | Admitting: Internal Medicine

## 2017-12-25 VITALS — BP 146/84 | HR 98 | Ht 74.0 in | Wt 194.0 lb

## 2017-12-25 DIAGNOSIS — J449 Chronic obstructive pulmonary disease, unspecified: Secondary | ICD-10-CM | POA: Diagnosis not present

## 2017-12-25 DIAGNOSIS — F17210 Nicotine dependence, cigarettes, uncomplicated: Secondary | ICD-10-CM | POA: Diagnosis not present

## 2017-12-25 MED ORDER — PREDNISONE 10 MG PO TABS: ORAL_TABLET | ORAL | 0 refills | Status: DC
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
## 2017-12-25 NOTE — Patient Instructions (Addendum)
Prednisone 10 mg 2 each until 100% better then 1 daily one week then one-half daily until return and if flare go back one step    Please schedule a follow up office visit in 6 weeks, call sooner if needed with all medications /inhalers/ solutions in hand so we can verify exactly what you are taking. This includes all medications from all doctors and over the counters  - consider HRCT looking for bronchiectasis/repeat sinus ct  on return

## 2017-12-28 ENCOUNTER — Encounter: Payer: Self-pay | Admitting: Internal Medicine

## 2017-12-28 NOTE — Assessment & Plan Note (Signed)
4-5 min discussion re active cigarette smoking in addition to office E&M  Ask about tobacco use:   ongoing Advise quitting   I took an extended  opportunity with this patient to outline the consequences of continued cigarette use  in airway disorders based on all the data we have from the multiple national lung health studies (perfomed over decades at millions of dollars in cost)  indicating that smoking cessation, not choice of inhalers or physicians, is the most important aspect of care.   Assess willingness:  Not committed at this point Assist in quit attempt:  Per PCP when ready Arrange follow up:   Follow up per Primary Care planned        

## 2017-12-28 NOTE — Assessment & Plan Note (Signed)

## 2017-12-28 NOTE — Assessment & Plan Note (Signed)
Spirometry 06/08/2017  FEV1 1.43 (36%)  Ratio 41 with classic curvature   - 06/08/2017   stiolto sample > did not tol due to muscle cramps in neck and shoulders - 06/28/2017  After extensive coaching inhaler device  effectiveness =    90% > try dulera 200 2bid and pred 20 mg daily until better then 10 mg daily > much better 07/07/2017  - PFT's  07/07/2017  FEV1 1.70 (44 % ) ratio 42  p 5 % improvement from saba p dulera 200 prior to study with DLCO  34/37 % corrects to 92  % for alv volume   - 07/07/2017 taper off pred x 2 weeks  - alpha one screening 07/07/2017  >  MZ level 98   - 08/24/2017  After extensive coaching inhaler device  effectiveness =    90% with SMI > try add spiriva 2 respimat each am - 10/09/17 placed on daily prednisone    - 12/25/2017  After extensive coaching inhaler device  effectiveness =    90%   DDX of  difficult airways management almost all start with A and  include Adherence, Ace Inhibitors, Acid Reflux, Active Sinus Disease, Alpha 1 Antitripsin deficiency, Anxiety masquerading as Airways dz,  ABPA,  Allergy(esp in young), Aspiration (esp in elderly), Adverse effects of meds,  Active smokers, A bunch of PE's (a small clot burden can't cause this syndrome unless there is already severe underlying pulm or vascular dz with poor reserve) plus two Bs  = Bronchiectasis and Beta blocker use..and one C= CHF   Adherence is always the initial "prime suspect" and is a multilayered concern that requires a "trust but verify" approach in every patient - starting with knowing how to use medications, especially inhalers, correctly, keeping up with refills and understanding the fundamental difference between maintenance and prns vs those medications only taken for a very short course and then stopped and not refilled.  - see device teaching - return with all meds in hand using a trust but verify approach to confirm accurate Medication  Reconciliation The principal here is that until we are certain  that the  patients are doing what we've asked, it makes no sense to ask them to do more.   Active smoking biggest concern - see sep a/p  ? Allergy/asthma component > The goal with a chronic steroid dependent illness is always arriving at the lowest effective dose that controls the disease/symptoms and not accepting a set "formula" which is based on statistics or guidelines that don't always take into account patient  variability or the natural hx of the dz in every individual patient, which may well vary over time.  For now therefore I recommend the patient maintain  20 mg /day ceiling and taper to 5 mg per day floor  Active sinus dz > resolved as of 11/13/17 but may need repeat   ? Alpha One def > discussed again with him active smoking makes him look really like a ZZ in terms of balance between protease/ antiproteases but he is in no way a candidate for replacement if he is still smoking   ? Anxiety/depression > usually at the bottom of this list of usual suspects but should be much higher on this pt's based on H and P and may interfere with adherence and also interpretation of response or lack thereof to symptom management which can be quite subjective.   ? Bronchiectasis > consider hrct chest next ov     I had  an extended discussion with the patient reviewing all relevant studies completed to date and  lasting 15 to 20 minutes of a 25 minute visit    See device teaching which extended face to face time for this visit.  Each maintenance medication was reviewed in detail including emphasizing most importantly the difference between maintenance and prns and under what circumstances the prns are to be triggered using an action plan format that is not reflected in the computer generated alphabetically organized AVS which I have not found useful in most complex patients, especially with respiratory illnesses  Please see AVS for specific instructions unique to this visit that I personally wrote and  verbalized to the the pt in detail and then reviewed with pt  by my nurse highlighting any  changes in therapy recommended at today's visit to their plan of care.

## 2018-01-04 ENCOUNTER — Encounter: Payer: Self-pay | Admitting: Gastroenterology

## 2018-01-04 ENCOUNTER — Telehealth: Payer: Self-pay | Admitting: Gastroenterology

## 2018-01-04 ENCOUNTER — Ambulatory Visit (INDEPENDENT_AMBULATORY_CARE_PROVIDER_SITE_OTHER): Payer: Medicare Other | Admitting: Gastroenterology

## 2018-01-04 VITALS — BP 130/90 | HR 87 | Ht 74.0 in | Wt 192.4 lb

## 2018-01-04 DIAGNOSIS — R109 Unspecified abdominal pain: Secondary | ICD-10-CM

## 2018-01-04 DIAGNOSIS — R100 Acute abdomen: Secondary | ICD-10-CM

## 2018-01-04 NOTE — Telephone Encounter (Signed)
Patient wife calling requesting a referral to Dr.Adam Daily at [facility] in [place]. Phone #[phone] for pain management.

## 2018-01-04 NOTE — Telephone Encounter (Signed)
I spoke with his wife and they want Dr Daily to evaluate the MSK pain at the left 10th rib.  I did send a copy of the office note from today.

## 2018-01-04 NOTE — Progress Notes (Signed)
Chief Complaint: LUQ pain   Referring Provider:   Dr. Sandrea Hughs, MD      HPI:     Robert Pugh is a 67 y.o. male referred to the [facility]ology Clinic for evaluation of chronic LUQ pain. He has had an extensive evaluation for this in the past, as summarized below. He states that his sxs essentially started after admission in 1991 for ?emypyema with left decortication, described as intense left sided pain surrounding his rib cage. Sxs have essentially persisted, and over the last 10 years or so describes exacerbation of pain with eating large amounts of food, with some associated SOB when pain intensifies. Type of food is not a/w worsening/alleviated sxs. Pain also exacerbated by supine. He has trialed dietary modifications to include low fiber foods along with acid suppression therapy, without any clinical improvement. He follows with Dr Sherene Sires in Pulmonology.   Overall, sxs are unchanged for many years, and presenting today for eval and potential treatment. He is otherwise without nausea, vomiting, diarrhea, constipation, hematochezia, melena. He had Ex lap with LOA for SBO in Jan 2018 without any clinical improvement in chronic sxs.   Available records reviewed and evaluation to date notable for: 2018: January: Ex-lap with LOA for SBO per patient in [place] May: Appendectomy for acute appendicitis per patient in [place]  2014: [facility] (Dr. Channing Mutters): Left T10, T11, T12 intercostal block 02/2013, reporting initial 100% improvement at time of f/u. Recommend observation and if recurrence, plan for either repeat injection or intercostal ablative procedure [facility] ([facility] [place]): MRI abd/pelvis normal with referral to Pain Management  Prior to 2014, had an extensive evaluation in [place] by Internal Medicine, Genral Surgery, GI, and Neurology, which was essentially unrevealing for etiology, including: Colonoscopy x2 (2010, 2011), EGD (2012), VCE (?), CT, serologic  eval, ex lap       Past Medical History:  Diagnosis Date  . Arthritis   . Bowel obstruction (HCC)   . Chronic headaches   . GERD (gastroesophageal reflux disease)   . High blood pressure   . Kidney stone   . Pneumonia      Past Surgical History:  Procedure Laterality Date  . APPENDECTOMY  09/2016  . HERNIA REPAIR Right 2006  . KNEE SURGERY Left   . Left shoulder repair     . plural effusion  1991  . ROTATOR CUFF REPAIR Right    Family History  Problem Relation Age of Onset  . Diabetes Mother   . Multiple sclerosis Mother   . Heart disease Father   . Congestive Heart Failure Father   . Esophageal cancer Neg Hx   . Colon cancer Neg Hx   . Pancreatic cancer Neg Hx   . Prostate cancer Neg Hx    Social History   Tobacco Use  . Smoking status: Current Every Day Smoker    Packs/day: 0.25    Years: 40.00    Pack years: 10.00    Types: Cigarettes  . Smokeless tobacco: Never Used  Substance Use Topics  . Alcohol use: Not Currently  . Drug use: Not on file   Current Outpatient Medications  Medication Sig Dispense Refill  . albuterol (PROAIR HFA) 108 (90 Base) MCG/ACT inhaler 2 puffs every 4 hours as needed only  if your can't catch your breath 1 Inhaler 1  . cetirizine (ZYRTEC) 10 MG tablet Take 10 mg by mouth daily.    Marland Kitchen  ondansetron (ZOFRAN) 8 MG tablet Take 8 mg by mouth as needed for nausea or vomiting.    . predniSONE (DELTASONE) 10 MG tablet Take  2 each am until better then 1 daily x 5 days, then one half daily  And then stop 100 tablet 0  . Tiotropium Bromide-Olodaterol (STIOLTO RESPIMAT) 2.5-2.5 MCG/ACT AERS Inhale 2 puffs into the lungs daily. 1 Inhaler 11  . VERAPAMIL HCL PO Take 120 mg by mouth daily.     No current facility-administered medications for this visit.    Allergies  Allergen Reactions  . Gabapentin     Arms and legs jerking, vision changes     Review of Systems: All systems reviewed and negative except where noted in HPI.      Physical Exam:    Wt Readings from Last 3 Encounters:  01/04/18 192 lb 6 oz (87.3 kg)  12/25/17 194 lb (88 kg)  11/13/17 189 lb (85.7 kg)    BP 130/90   Pulse 87   Ht 6\' 2"  (1.88 m)   Wt 192 lb 6 oz (87.3 kg)   BMI 24.70 kg/m  Constitutional:  Pleasant, in no acute distress. Psychiatric: Normal mood and affect. Behavior is normal. EENT: Pupils normal.  Conjunctivae are normal. No scleral icterus. Neck supple. No cervical LAD. Cardiovascular: Normal rate, regular rhythm. No edema Pulmonary/chest: Effort normal and breath sounds normal. No wheezing, rales or rhonchi. Abdominal: Soft, nondistended. Bowel sounds active throughout. There are no masses palpable. No hepatomegaly. +Carnett's sign located adjacent to left costal margin. Has TTP along left 10th rib line, tracing back to prior well healed incision from prior thoracotomy. Otherwise, well healed midline incision and lap port sites.  Neurological: Alert and oriented to person place and time. Skin: Skin is warm and dry. No rashes noted.   ASSESSMENT AND PLAN;   Robert Pugh is a 67 y.o. male presenting with chronic LUQ pain which is actually located along the left costal margin, most notably along the left 10th rib, with a positive Carnett's sign, all indicative of MSK etiology for presenting sxs. Aside from exacerbation of sxs with large food boluses, he remains largely without GI sxs and has had an extensive unrevealing GI evaluation as outlined above. I do not recommend any further endoscopic eval or abdominal imaging at this time, but instead recommend return to Pain Management. He states that he currently sees a Pain Management specialist for back pain, but has never brought up his costal pain to him. I recommend that he do so and discuss tx options given his comfort with this physician. If no tx can be done there for this issue, then will recommend returning to Pain Management at [facility] (previously [facility] by [facility]) for  discussion of either repeat T10, T11, T12 intercostal block vs intercostal ablative procedure vs new modality. As for the etiology, while we discussed post herpetic neuralgia given seemingly isolated dermatome, no hx of vesicles and the very chronic nature and non-response to medical management (ie, Gabapentin) seems less likely.   - To follow-up with Pain Management as above - No plan for endoscopic intervention at this time - Can follow-up with me prn  I spent a total of 45 minutes of face-to-face time with the patient and his wife. Greater than 50% of the time was spent counseling and coordinating care.      Channing Mutters, DO, FACG  01/04/2018, 10:44 AM   01/06/2018, Robert Dingwall, MD

## 2018-01-04 NOTE — Patient Instructions (Signed)
If you are age 66 or older, your body mass index should be between 23-30. Your Body mass index is 24.7 kg/m. If this is out of the aforementioned range listed, please consider follow up with your Primary Care Provider.  If you are age 64 or younger, your body mass index should be between 19-25. Your Body mass index is 24.7 kg/m. If this is out of the aformentioned range listed, please consider follow up with your Primary Care Provider.   Follow up with [place] as needed.  It was a pleasure to see you today!  Vito Cirigliano, D.O.

## 2018-01-15 ENCOUNTER — Encounter: Payer: Self-pay | Admitting: Internal Medicine

## 2018-01-25 ENCOUNTER — Other Ambulatory Visit (INDEPENDENT_AMBULATORY_CARE_PROVIDER_SITE_OTHER): Payer: Medicare Other

## 2018-01-25 ENCOUNTER — Encounter: Payer: Self-pay | Admitting: Internal Medicine

## 2018-01-25 ENCOUNTER — Ambulatory Visit: Payer: Medicare Other | Admitting: Internal Medicine

## 2018-01-25 ENCOUNTER — Ambulatory Visit (INDEPENDENT_AMBULATORY_CARE_PROVIDER_SITE_OTHER)
Admission: RE | Admit: 2018-01-25 | Discharge: 2018-01-25 | Disposition: A | Discharge status: Home / Self Care | Payer: Medicare Other | Source: Ambulatory Visit | Attending: Internal Medicine | Admitting: Internal Medicine

## 2018-01-25 VITALS — BP 150/90 | HR 89 | Ht 71.75 in | Wt 192.8 lb

## 2018-01-25 DIAGNOSIS — R058 Other specified cough: Secondary | ICD-10-CM

## 2018-01-25 DIAGNOSIS — J181 Lobar pneumonia, unspecified organism: Secondary | ICD-10-CM | POA: Diagnosis not present

## 2018-01-25 DIAGNOSIS — F17210 Nicotine dependence, cigarettes, uncomplicated: Secondary | ICD-10-CM | POA: Diagnosis not present

## 2018-01-25 DIAGNOSIS — J189 Pneumonia, unspecified organism: Secondary | ICD-10-CM

## 2018-01-25 DIAGNOSIS — R05 Cough: Secondary | ICD-10-CM | POA: Diagnosis not present

## 2018-01-25 DIAGNOSIS — R0789 Other chest pain: Secondary | ICD-10-CM

## 2018-01-25 DIAGNOSIS — J449 Chronic obstructive pulmonary disease, unspecified: Secondary | ICD-10-CM | POA: Diagnosis not present

## 2018-01-25 DIAGNOSIS — I10 Essential (primary) hypertension: Secondary | ICD-10-CM

## 2018-01-25 LAB — CBC WITH DIFFERENTIAL/PLATELET
BASOS PCT: 0.8 % (ref 0.0–3.0)
Basophils Absolute: 0.1 10*3/uL (ref 0.0–0.1)
EOS ABS: 0 10*3/uL (ref 0.0–0.7)
Eosinophils Relative: 0.3 % (ref 0.0–5.0)
HCT: 47.4 % (ref 39.0–52.0)
HEMOGLOBIN: 15.1 g/dL (ref 13.0–17.0)
Lymphocytes Relative: 10.3 % — ABNORMAL LOW (ref 12.0–46.0)
Lymphs Abs: 1.7 10*3/uL (ref 0.7–4.0)
MCHC: 32 g/dL (ref 30.0–36.0)
MCV: 67.6 fl — ABNORMAL LOW (ref 78.0–100.0)
MONO ABS: 0.9 10*3/uL (ref 0.1–1.0)
Monocytes Relative: 5.5 % (ref 3.0–12.0)
Neutro Abs: 13.4 10*3/uL — ABNORMAL HIGH (ref 1.4–7.7)
Neutrophils Relative %: 83.1 % — ABNORMAL HIGH (ref 43.0–77.0)
PLATELETS: 427 10*3/uL — AB (ref 150.0–400.0)
RBC: 7 Mil/uL — ABNORMAL HIGH (ref 4.22–5.81)
RDW: 16.2 % — AB (ref 11.5–15.5)
WBC: 16.1 10*3/uL — AB (ref 4.0–10.5)

## 2018-01-25 LAB — SEDIMENTATION RATE: Sed Rate: 7 mm/hr (ref 0–20)
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
## 2018-01-25 NOTE — Patient Instructions (Addendum)
Reduce the prednisone to 10 mg daily   Please remember to go to the lab and x-ray department downstairs in the basement  for your tests - we will call you with the results when they are available.    Sign for records from [facility]  Try valium 5 mg as needed for muscle spasm and combine it with advil up to 3 with meals and if no better change to percocet   Keep appt for follow up - I may contact you to schedule additional studies to be done the same day prior to your  4 pm  visit  - rec add clonidine 0.1 bid

## 2018-01-26 ENCOUNTER — Encounter: Payer: Self-pay | Admitting: Internal Medicine

## 2018-01-26 ENCOUNTER — Telehealth: Payer: Self-pay | Admitting: *Deleted

## 2018-01-26 DIAGNOSIS — I10 Essential (primary) hypertension: Secondary | ICD-10-CM | POA: Insufficient documentation

## 2018-01-26 MED ORDER — CLONIDINE HCL 0.1 MG PO TABS: 0.1000 mg | ORAL_TABLET | Freq: Two times a day (BID) | ORAL | 11 refills | Status: AC

## 2018-01-26 NOTE — Telephone Encounter (Signed)
LMTCB

## 2018-01-26 NOTE — Progress Notes (Signed)
msg already left for pot to call back

## 2018-01-26 NOTE — Telephone Encounter (Signed)
-----   Message from Nyoka Cowden, MD sent at 01/26/2018  5:47 AM EDT ----- After chart review, rec clonidine 0.1 bid -this will help pain, anxiety and may reduce cig craving all at once and his bp was quite high at ov so should be a quadruple whammy

## 2018-01-26 NOTE — Assessment & Plan Note (Signed)
01/25/2018  Added clonidine 0.1 mg bid for hbp/ chronic pain ? Help with d/c cigs

## 2018-01-26 NOTE — Assessment & Plan Note (Addendum)
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

## 2018-01-26 NOTE — Progress Notes (Signed)
LMTCB

## 2018-01-26 NOTE — Assessment & Plan Note (Addendum)
4-5 min discussion re active cigarette smoking in addition to office E&M  Ask about tobacco use:   ongoing Advise quitting   Reviewed MZ status emphasizing how critical it is in this setting to quit now  Assess willingness:  Not committed at this point Assist in quit attempt:  Per PCP when ready- in meantime add clonidine 0.1 bid  Arrange follow up:   Follow up per Primary Care planned

## 2018-01-26 NOTE — Assessment & Plan Note (Signed)
Spirometry 06/08/2017  FEV1 1.43 (36%)  Ratio 41 with classic curvature   - 06/08/2017   stiolto sample > did not tol due to muscle cramps in neck and shoulders - 06/28/2017  After extensive coaching inhaler device  effectiveness =    90% > try dulera 200 2bid and pred 20 mg daily until better then 10 mg daily > much better 07/07/2017  - PFT's  07/07/2017  FEV1 1.70 (44 % ) ratio 42  p 5 % improvement from saba p dulera 200 prior to study with DLCO  34/37 % corrects to 92  % for alv volume   - 07/07/2017 taper off pred x 2 weeks  - alpha one screening 07/07/2017  >  MZ level 98   - 08/24/2017  After extensive coaching inhaler device  effectiveness =    90% with SMI > try add spiriva 2 respimat each am - 10/09/17 placed on daily prednisone   - 12/25/2017  After extensive coaching inhaler device  effectiveness =    90%  No evidence of an asthmatic component > rec drop prednisone down to 10 mg daily , continue stiolto

## 2018-01-26 NOTE — Assessment & Plan Note (Signed)
Sinus CT 10/17/2017 >>>  Acute L max sinusitis > augmentin x 20 days then ov 21 days with sinus CT > resolved but still coughing - gabapentin 100  Mg tid trial  Plus prn  1st gen H1 blockers> could not tol gabapentin / cough better s gerd rx as of 01/25/2018   Consider repeat sinus ct prn flare

## 2018-01-27 ENCOUNTER — Encounter: Payer: Self-pay | Admitting: Internal Medicine
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
## 2018-01-29 NOTE — Telephone Encounter (Signed)
atc pt X2, line rang X3 rings and then line was disconnected. Wcb.

## 2018-01-29 NOTE — Telephone Encounter (Signed)
LMTCB

## 2018-01-29 NOTE — Progress Notes (Signed)
LMTCB

## 2018-01-29 NOTE — Telephone Encounter (Signed)
Called patient unable to reach left message to give us a call back.

## 2018-01-29 NOTE — Telephone Encounter (Signed)
Pt is calling back [phone] 

## 2018-01-29 NOTE — Telephone Encounter (Signed)
Pt is calling back [phone]

## 2018-01-30 NOTE — Progress Notes (Signed)
Left detailed msg ok per DPR

## 2018-01-30 NOTE — Telephone Encounter (Signed)
Pt returned call. Pt contact number [phone]

## 2018-01-30 NOTE — Telephone Encounter (Signed)
Called spoke with patient who reported that he had already picked up the Clonidine 0.1mg  this morning but has not begun taking it yet  Discussed with patient MW's recommendations and reason for the additional medication Pt okay with this and voiced his understanding  Nothing further needed; will sign off

## 2018-02-06 ENCOUNTER — Ambulatory Visit: Payer: Medicare Other | Admitting: Internal Medicine

## 2018-02-06 ENCOUNTER — Encounter: Payer: Self-pay | Admitting: Internal Medicine

## 2018-02-06 ENCOUNTER — Other Ambulatory Visit (INDEPENDENT_AMBULATORY_CARE_PROVIDER_SITE_OTHER): Payer: Medicare Other

## 2018-02-06 VITALS — BP 140/80 | HR 98 | Ht 71.75 in | Wt 194.6 lb

## 2018-02-06 DIAGNOSIS — F17210 Nicotine dependence, cigarettes, uncomplicated: Secondary | ICD-10-CM

## 2018-02-06 DIAGNOSIS — I10 Essential (primary) hypertension: Secondary | ICD-10-CM | POA: Diagnosis not present

## 2018-02-06 DIAGNOSIS — J449 Chronic obstructive pulmonary disease, unspecified: Secondary | ICD-10-CM

## 2018-02-06 DIAGNOSIS — R109 Unspecified abdominal pain: Secondary | ICD-10-CM

## 2018-02-06 LAB — URINALYSIS
BILIRUBIN URINE: NEGATIVE
HGB URINE DIPSTICK: NEGATIVE
Ketones, ur: NEGATIVE
LEUKOCYTES UA: NEGATIVE
NITRITE: NEGATIVE
PH: 6 (ref 5.0–8.0)
Specific Gravity, Urine: 1.025 (ref 1.000–1.030)
Total Protein, Urine: NEGATIVE
Urine Glucose: 100 — AB
Urobilinogen, UA: 0.2 (ref 0.0–1.0)

## 2018-02-06 NOTE — Patient Instructions (Addendum)
No change in medication - only use the advil for joint pains as needed   Please remember to go to the lab department downstairs in the basement  for your tests - we will call you with the results when they are available.      Please schedule a follow up visit in 3 months but call sooner if needed
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
## 2018-02-08 ENCOUNTER — Encounter: Payer: Self-pay | Admitting: Internal Medicine

## 2018-02-08 NOTE — Assessment & Plan Note (Signed)
4-5 min discussion re active cigarette smoking in addition to office E&M  Ask about tobacco use:   ongoing Advise quitting   > 3 min Discussed the risks and costs (both direct and indirect)  of smoking relative to the benefits of quitting but patient unwilling to commit at this point to a specific quit date.    Assess willingness:  Not committed at this point Assist in quit attempt:  Per PCP when ready Arrange follow up:   Follow up per Primary Care planned

## 2018-02-08 NOTE — Assessment & Plan Note (Signed)
No physical finding/ neg u/a with no GI symptoms so most likely this is part of the spectrum of unexplained L sided chest pains which really all remain unexplained at this point other than the original post thoracotomy pain > already seeing pain specialist and nothing else to offer from pulmonary clinic

## 2018-02-08 NOTE — Assessment & Plan Note (Signed)
01/25/2018  Added clonidine 0.1 mg bid for hbp/ chronic pain ? Help with d/c cigs   ? May have helped with pain control,  Adequate bp  control on present rx, reviewed in detail with pt > no change in rx needed

## 2018-02-08 NOTE — Assessment & Plan Note (Signed)
Spirometry 06/08/2017  FEV1 1.43 (36%)  Ratio 41 with classic curvature   - 06/08/2017   stiolto sample > did not tol due to muscle cramps in neck and shoulders - 06/28/2017  After extensive coaching inhaler device  effectiveness =    90% > try dulera 200 2bid and pred 20 mg daily until better then 10 mg daily > much better 07/07/2017  - PFT's  07/07/2017  FEV1 1.70 (44 % ) ratio 42  p 5 % improvement from saba p dulera 200 prior to study with DLCO  34/37 % corrects to 92  % for alv volume   - 07/07/2017 taper off pred x 2 weeks  - alpha one screening 07/07/2017  >  MZ level 98   - 08/24/2017  After extensive coaching inhaler device  effectiveness =    90% with SMI > try add spiriva 2 respimat each am - 10/09/17 placed on daily prednisone   - 12/25/2017  After extensive coaching inhaler device  effectiveness =    90%  Better compensated on stiolto:  Pt is Group B in terms of symptom/risk and laba/lama therefore appropriate rx at this point.

## 2018-03-30 ENCOUNTER — Other Ambulatory Visit: Payer: Self-pay | Admitting: Internal Medicine

## 2018-05-08 ENCOUNTER — Ambulatory Visit: Payer: Medicare Other | Admitting: Internal Medicine

## 2018-05-29 ENCOUNTER — Encounter: Payer: Self-pay | Admitting: Internal Medicine

## 2018-05-29 ENCOUNTER — Ambulatory Visit (INDEPENDENT_AMBULATORY_CARE_PROVIDER_SITE_OTHER)
Admission: RE | Admit: 2018-05-29 | Discharge: 2018-05-29 | Disposition: A | Discharge status: Home / Self Care | Payer: Medicare Other | Source: Ambulatory Visit | Attending: Internal Medicine | Admitting: Internal Medicine

## 2018-05-29 ENCOUNTER — Ambulatory Visit: Payer: Medicare Other | Admitting: Internal Medicine

## 2018-05-29 ENCOUNTER — Telehealth: Payer: Self-pay | Admitting: Internal Medicine

## 2018-05-29 VITALS — BP 128/86 | HR 102 | Ht 71.75 in | Wt 194.0 lb

## 2018-05-29 DIAGNOSIS — J449 Chronic obstructive pulmonary disease, unspecified: Secondary | ICD-10-CM

## 2018-05-29 DIAGNOSIS — M069 Rheumatoid arthritis, unspecified: Secondary | ICD-10-CM

## 2018-05-29 DIAGNOSIS — R05 Cough: Secondary | ICD-10-CM

## 2018-05-29 DIAGNOSIS — F17210 Nicotine dependence, cigarettes, uncomplicated: Secondary | ICD-10-CM

## 2018-05-29 DIAGNOSIS — R058 Other specified cough: Secondary | ICD-10-CM

## 2018-05-29 MED ORDER — PREDNISONE 10 MG PO TABS: ORAL_TABLET | ORAL | 0 refills | Status: DC

## 2018-05-29 MED ORDER — METHYLPREDNISOLONE ACETATE 80 MG/ML IJ SUSP
120.0000 mg | Freq: Once | INTRAMUSCULAR | Status: AC
  Administered 2018-05-29: 120 mg via INTRAMUSCULAR

## 2018-05-29 MED ORDER — TIOTROPIUM BROMIDE-OLODATEROL 2.5-2.5 MCG/ACT IN AERS: 1.0000 | INHALATION_SPRAY | Freq: Every day | RESPIRATORY_TRACT | 0 refills | Status: DC

## 2018-05-29 MED ORDER — ACETAMINOPHEN-CODEINE #3 300-30 MG PO TABS: 1.0000 | ORAL_TABLET | ORAL | 0 refills | Status: AC | PRN

## 2018-05-29 NOTE — Telephone Encounter (Signed)
Prednisone 10 mg take 2 daily until better, then one daily x 5 days then one half daily- if worse start over

## 2018-05-29 NOTE — Telephone Encounter (Signed)
I called [place] pharmacy and I  gave directions to the pharmacist for the prednisone. Nothing further is needed.

## 2018-05-29 NOTE — Progress Notes (Signed)
Spoke with pt and notified of results per Dr. Wert. Pt verbalized understanding and denied any questions. 

## 2018-05-29 NOTE — Patient Instructions (Addendum)
Take delsym two tsp every 12 hours and supplement if needed with tylenol #3  up to 1-2 every 4 hours to suppress the urge to cough. Swallowing water and/or using ice chips/non mint and menthol containing candies (such as lifesavers or sugarless jolly ranchers) are also effective.  You should rest your voice and avoid activities that you know make you cough.  Once you have eliminated the cough for 3 straight days try reducing the tylenol #3  then the delsym as tolerated.   Prednisone 10 mg take 2 daily until better, then one daily x 5 days then one half daily   Please remember to go to the  x-ray department at the [facility] building (directly across from [facility])  @ [address] [place]  in the basement  for your tests - we will call you with the results when they are available    Please schedule a follow up office visit in 4 weeks, sooner if needed  with all medications /inhalers/ solutions in hand so we can verify exactly what you are taking. This includes all medications from all doctors and over the counters - bring your drug formulary for options on best choice for your resp medications
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
## 2018-05-29 NOTE — Telephone Encounter (Signed)
Dr. Sherene Sires, did you want to give quantity of 100? The pharmacist states she cannot use the directions on the Rx. I have the instructions below. I am just afraid the insurance will not cover the quantity for 30 days without detailed instructions. Please advise.    Patient Instructions by Nyoka Cowden, MD at 05/29/2018 11:45 AM  Author: Nyoka Cowden, MD Author Type: Physician Filed: 05/29/2018 12:31 PM  Note Status: Addendum Cosign: Cosign Not Required Encounter Date: 05/29/2018  Editor: Nyoka Cowden, MD (Physician)  Prior Versions: 1. Nyoka Cowden, MD (Physician) at 05/29/2018 12:30 PM - Signed    Take delsym two tsp every 12 hours and supplement if needed with tylenol #3  up to 1-2 every 4 hours to suppress the urge to cough. Swallowing water and/or using ice chips/non mint and menthol containing candies (such as lifesavers or sugarless jolly ranchers) are also effective.  You should rest your voice and avoid activities that you know make you cough.  Once you have eliminated the cough for 3 straight days try reducing the tylenol #3  then the delsym as tolerated.   Prednisone 10 mg take 2 daily until better, then one daily x 5 days then one half daily   Please remember to go to the  x-ray department at the [facility] building (directly across from [facility])  @ [address] [place]  in the basement  for your tests - we will call you with the results when they are available    Please schedule a follow up office visit in 4 weeks, sooner if needed  with all medications /inhalers/ solutions in hand so we can verify exactly what you are taking. This includes all medications from all doctors and over the counters - bring your drug formulary for options on best choice for your resp medications

## 2018-05-30 ENCOUNTER — Encounter: Payer: Self-pay | Admitting: Internal Medicine

## 2018-05-30 DIAGNOSIS — M069 Rheumatoid arthritis, unspecified: Secondary | ICD-10-CM | POA: Insufficient documentation

## 2018-05-30 NOTE — Assessment & Plan Note (Addendum)
Quit smoking 04/2018 Spirometry 06/08/2017  FEV1 1.43 (36%)  Ratio 41 with classic curvature   - 06/08/2017   stiolto sample > did not tol due to muscle cramps in neck and shoulders - 06/28/2017  After extensive coaching inhaler device  effectiveness =    90% > try dulera 200 2bid and pred 20 mg daily until better then 10 mg daily > much better 07/07/2017  - PFT's  07/07/2017  FEV1 1.70 (44 % ) ratio 42  p 5 % improvement from saba p dulera 200 prior to study with DLCO  34/37 % corrects to 92  % for alv volume   - 07/07/2017 taper off pred x 2 weeks  - alpha one screening 07/07/2017  >  MZ level 98   - 08/24/2017  After extensive coaching inhaler device  effectiveness =    90% with SMI > try add spiriva 2 respimat each am - 10/09/17 placed on daily prednisone   - 12/25/2017  After extensive coaching inhaler device  effectiveness =    90%   Def flare of symptoms off cigs and  pred so  difficult to tease out how much is actually copd related > no change in copd meds needed for now and continue off cigs if at all possible (wife still smokes so will be a challenge)

## 2018-05-30 NOTE — Assessment & Plan Note (Signed)
Onset early 2019  Sinus CT 10/17/2017 >>>  Acute L max sinusitis > augmentin x 20 days then ov 21 days with sinus CT > resolved but still coughing - gabapentin 100  Mg tid trial  Plus prn  1st gen H1 blockers> could not tol gabapentin / cough better s gerd rx as of 01/25/2018   Flared as did RA off prednisone and now incessant severe cough   Of the three most common causes of  Sub-acute / recurrent or chronic cough, only one (GERD)  can actually contribute to/ trigger  the other two (asthma and post nasal drip syndrome)  and perpetuate the cylce of cough.  While not intuitively obvious, many patients with chronic low grade reflux do not cough until there is a primary insult that disturbs the protective epithelial barrier and exposes sensitive nerve endings.   This is typically viral but can due to PNDS and  either may apply here.     >>>  The point is that once this occurs, it is difficult to eliminate the cycle  using anything but a maximally effective acid suppression regimen at least in the short run, accompanied by an appropriate diet to address non acid GERD and control / eliminate the cough itself for at least 3 days with tyl #3

## 2018-05-30 NOTE — Assessment & Plan Note (Signed)
Says quit but wife still does  Counseled re importance of maintaining smoking cessation but did not meet time criteria for separate billing

## 2018-05-30 NOTE — Assessment & Plan Note (Signed)
Flared off steroids 05/29/2018 > restarted at 20 mg daily with taper to floor of 5 mg    The goal with a chronic steroid dependent illness is always arriving at the lowest effective dose that controls the disease/symptoms and not accepting a set "formula" which is based on statistics or guidelines that don't always take into account patient  variability or the natural hx of the dz in every individual patient, which may well vary over time.  For now therefore I recommend the patient maintain  20 mg ceiling and 5 mg floor for now pending f/u with rheumatology.    I had an extended discussion with the patient/wife  reviewing all relevant studies completed to date and  lasting 25 minutes of a 40  minute acute office  visit addressing multiple  severe non-specific but potentially very serious refractory respiratory symptoms of uncertain and potentially multiple  etiologies.   Each maintenance medication was reviewed in detail including most importantly the difference between maintenance and prns and under what circumstances the prns are to be triggered using an action plan format that is not reflected in the computer generated alphabetically organized AVS.    Please see AVS for specific instructions unique to this office visit that I personally wrote and verbalized to the the pt in detail and then reviewed with pt  by my nurse highlighting any changes in therapy/plan of care  recommended at today's visit.

## 2018-06-27 ENCOUNTER — Encounter: Payer: Self-pay | Admitting: Internal Medicine

## 2018-06-27 ENCOUNTER — Ambulatory Visit: Payer: Medicare Other | Admitting: Internal Medicine

## 2018-06-27 VITALS — BP 128/70 | HR 85 | Ht 71.75 in | Wt 192.4 lb

## 2018-06-27 DIAGNOSIS — F17210 Nicotine dependence, cigarettes, uncomplicated: Secondary | ICD-10-CM

## 2018-06-27 DIAGNOSIS — R05 Cough: Secondary | ICD-10-CM

## 2018-06-27 DIAGNOSIS — J449 Chronic obstructive pulmonary disease, unspecified: Secondary | ICD-10-CM | POA: Diagnosis not present

## 2018-06-27 DIAGNOSIS — R058 Other specified cough: Secondary | ICD-10-CM

## 2018-06-27 MED ORDER — GLYCOPYRROLATE-FORMOTEROL 9-4.8 MCG/ACT IN AERO: 2.0000 | INHALATION_SPRAY | Freq: Two times a day (BID) | RESPIRATORY_TRACT | 11 refills | Status: DC

## 2018-06-27 MED ORDER — GLYCOPYRROLATE-FORMOTEROL 9-4.8 MCG/ACT IN AERO: 2.0000 | INHALATION_SPRAY | Freq: Two times a day (BID) | RESPIRATORY_TRACT | 0 refills | Status: DC
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
## 2018-06-27 NOTE — Assessment & Plan Note (Addendum)
Quit smoking 04/2018 Spirometry 06/08/2017  FEV1 1.43 (36%)  Ratio 41 with classic curvature   - 06/08/2017   stiolto sample > did not tol due to muscle cramps in neck and shoulders - 06/28/2017  After extensive coaching inhaler device  effectiveness =    90% > try dulera 200 2bid and pred 20 mg daily until better then 10 mg daily > much better 07/07/2017  - PFT's  07/07/2017  FEV1 1.70 (44 % ) ratio 42  p 5 % improvement from saba p dulera 200 prior to study with DLCO  34/37 % corrects to 92  % for alv volume   - 07/07/2017 taper off pred x 2 weeks  - alpha one screening 07/07/2017  >  MZ level 98   - 08/24/2017   try add spiriva 2 respimat each am - 10/09/17 placed on daily prednisone   - 06/27/2018  After extensive coaching inhaler device,  effectiveness =    90%  Changed to bevespi req by insurance    Remains pred dep due to RA and copd/ab component > The goal with a chronic steroid dependent illness is always arriving at the lowest effective dose that controls the disease/symptoms and not accepting a set "formula" which is based on statistics or guidelines that don't always take into account patient  variability or the natural hx of the dz in every individual patient, which may well vary over time.  For now therefore I recommend the patient maintain  20 mg ceiling and 5 mg floor but no more codeine for cough and let's see if it's the pred vs the codeine that controls the cough.

## 2018-06-27 NOTE — Patient Instructions (Signed)
Add extra hydroxizine at bedtime   Plan A = Automatic = bevespi Take 2 puffs first thing in am and then another 2 puffs about 12 hours later.   Only use your albuterol as a rescue medication to be used if you can't catch your breath by resting or doing a relaxed purse lip breathing pattern.  - The less you use it, the better it will work when you need it. - Ok to use up to 2 puffs  every 4 hours if you must but call for immediate appointment if use goes up over your usual need - Don't leave home without it !!  (think of it like the spare tire for your car)   Prednisone 20 mg daily until better then 10 mg daily x 5 days then one half daily    Please schedule a follow up office visit in 4 weeks, sooner if needed

## 2018-06-27 NOTE — Assessment & Plan Note (Signed)

## 2018-06-27 NOTE — Assessment & Plan Note (Signed)
Onset early 2019  Sinus CT 10/17/2017 >>>  Acute L max sinusitis > augmentin x 20 days then ov 21 days with sinus CT > resolved but still coughing - gabapentin 100  Mg tid trial  Plus prn  1st gen H1 blockers> could not tol gabapentin / cough better s gerd rx as of 01/25/2018 > flared off pred 05/29/18 resumed prednisone 20 ceiling and 5 mg daily   Try adding another hydroxizine at hs and if not effective then consider trazadone  Vs elavil (may not be able to tol latter due to anticholinergic effects of bevespi)

## 2018-07-30 ENCOUNTER — Encounter: Payer: Self-pay | Admitting: Internal Medicine

## 2018-07-30 ENCOUNTER — Ambulatory Visit: Payer: Medicare Other | Admitting: Internal Medicine

## 2018-07-30 DIAGNOSIS — J449 Chronic obstructive pulmonary disease, unspecified: Secondary | ICD-10-CM

## 2018-07-30 DIAGNOSIS — R05 Cough: Secondary | ICD-10-CM | POA: Diagnosis not present

## 2018-07-30 DIAGNOSIS — F17210 Nicotine dependence, cigarettes, uncomplicated: Secondary | ICD-10-CM | POA: Diagnosis not present

## 2018-07-30 DIAGNOSIS — R058 Other specified cough: Secondary | ICD-10-CM

## 2018-07-30 LAB — CBC WITH DIFFERENTIAL/PLATELET
Basophils Absolute: 0 10*3/uL (ref 0.0–0.1)
Basophils Relative: 0.3 % (ref 0.0–3.0)
EOS PCT: 0.3 % (ref 0.0–5.0)
Eosinophils Absolute: 0 10*3/uL (ref 0.0–0.7)
HEMATOCRIT: 44.3 % (ref 39.0–52.0)
HEMOGLOBIN: 14.3 g/dL (ref 13.0–17.0)
LYMPHS ABS: 1.1 10*3/uL (ref 0.7–4.0)
LYMPHS PCT: 8.4 % — AB (ref 12.0–46.0)
MCHC: 32.2 g/dL (ref 30.0–36.0)
MONOS PCT: 4.6 % (ref 3.0–12.0)
Monocytes Absolute: 0.6 10*3/uL (ref 0.1–1.0)
Neutro Abs: 11.5 10*3/uL — ABNORMAL HIGH (ref 1.4–7.7)
Platelets: 387 10*3/uL (ref 150.0–400.0)
RBC: 6.73 Mil/uL — AB (ref 4.22–5.81)
RDW: 16.1 % — ABNORMAL HIGH (ref 11.5–15.5)
WBC: 13.3 10*3/uL — ABNORMAL HIGH (ref 4.0–10.5)

## 2018-07-30 MED ORDER — PREDNISONE 10 MG PO TABS: ORAL_TABLET | ORAL | 0 refills | Status: DC

## 2018-07-30 MED ORDER — HYDROXYZINE HCL 25 MG PO TABS: 25.0000 mg | ORAL_TABLET | Freq: Four times a day (QID) | ORAL | 2 refills | Status: DC
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
## 2018-07-30 NOTE — Patient Instructions (Signed)
No change in bevespi for now  Take 2 puffs first thing in am and then another 2 puffs about 12 hours later.   Add back the prilosec 40 mg Take 30-60 min before first meal of the day and pepcid 20mg  at bedtime   Increase hydroxyzine to 25 mg four times daily   Prednisone 20 mg ceiling and 5 mg floor   Please remember to go to the lab department   for your tests - we will call you with the results when they are available.      Please schedule a follow up office visit in 6 weeks, call sooner if needed

## 2018-07-31 LAB — RESPIRATORY ALLERGY PROFILE REGION II ~~LOC~~
Allergen, A. alternata, m6: <0.1 kU/L
Allergen, Comm Silver Birch, t9: <0.1 kU/L
Allergen, Cottonwood, t14: <0.1 kU/L
Allergen, D pternoyssinus,d7: <0.1 kU/L
Allergen, Mouse Urine Protein, e78: <0.1 kU/L
Bermuda Grass: <0.1 kU/L
Box Elder IgE: <0.1 kU/L
CLADOSPORIUM HERBARUM (M2) IGE: <0.1 kU/L
CLASS: 0
CLASS: 0
CLASS: 0
CLASS: 0
CLASS: 0
CLASS: 0
CLASS: 0
CLASS: 0
CLASS: 0
CLASS: 0
CLASS: 0
CLASS: 0
CLASS: 0
CLASS: 0
CLASS: 0
CLASS: 0
CLASS: 0
CLASS: 0
COMMON RAGWEED (SHORT) (W1) IGE: <0.1 kU/L
Class: 0
Class: 0
Class: 0
Class: 0
Class: 0
Class: 0
Dog Dander: <0.1 kU/L
Elm IgE: <0.1 kU/L
IgE (Immunoglobulin E), Serum: 34 kU/L (ref <=114)
Johnson Grass: <0.1 kU/L
Pecan/Hickory Tree IgE: <0.1 kU/L
Sheep Sorrel IgE: <0.1 kU/L

## 2018-07-31 LAB — INTERPRETATION:

## 2018-08-06 ENCOUNTER — Encounter: Payer: Self-pay | Admitting: Internal Medicine

## 2018-08-06 NOTE — Assessment & Plan Note (Signed)
Counseled re importance of smoking cessation but did not meet time criteria for separate billing   °

## 2018-08-06 NOTE — Assessment & Plan Note (Signed)
Quit smoking 04/2018 Spirometry 06/08/2017  FEV1 1.43 (36%)  Ratio 41 with classic curvature   - 06/08/2017   stiolto sample > did not tol due to muscle cramps in neck and shoulders - 06/28/2017  After extensive coaching inhaler device  effectiveness =    90% > try dulera 200 2bid and pred 20 mg daily until better then 10 mg daily > much better 07/07/2017  - PFT's  07/07/2017  FEV1 1.70 (44 % ) ratio 42  p 5 % improvement from saba p dulera 200 prior to study with DLCO  34/37 % corrects to 92  % for alv volume   - 07/07/2017 taper off pred x 2 weeks  - alpha one screening 07/07/2017  >  MZ level 98   - 08/24/2017   try add spiriva 2 respimat each am - 10/09/17 placed on daily prednisone   - 06/27/2018    Changed to bevespi req by insurance   - 07/30/2018  After extensive coaching inhaler device,  effectiveness =    90%     Group D in terms of symptom/risk and laba/lama/ICS  therefore appropriate rx at this point >>>  He is on chronic steroids for RA so just needs lama/laba for now = bevespi.   The goal with a chronic steroid dependent illness is always arriving at the lowest effective dose that controls the disease/symptoms and not accepting a set "formula" which is based on statistics or guidelines that don't always take into account patient  variability or the natural hx of the dz in every individual patient, which may well vary over time.  For now therefore I recommend the patient maintain  Ceiling of 20 mg and floor of 10 mg daily

## 2018-08-06 NOTE — Assessment & Plan Note (Addendum)
Onset early 2019  Sinus CT 10/17/2017 >>>  Acute L max sinusitis > augmentin x 20 days then ov 21 days with sinus CT > resolved but still coughing - gabapentin 100  Mg tid trial  Plus prn  1st gen H1 blockers> could not tol gabapentin / cough better s gerd rx as of 01/25/2018 > flared off pred 05/29/18 resumed prednisone 20 ceiling and 5 mg daily  - Allergy profile 07/30/2018 >  Eos 0.0 /  IgE  34 RAST neg  Rhinitis is therefore non-specific and may improve with rec to increase to  qid hydroxyzine but  most likely aggravated by cigs/ advised   I had an extended discussion with the patient reviewing all relevant studies completed to date and  lasting 15 to 20 minutes of a 25 minute visit    See device teaching which extended face to face time for this visit.  Each maintenance medication was reviewed in detail including emphasizing most importantly the difference between maintenance and prns and under what circumstances the prns are to be triggered using an action plan format that is not reflected in the computer generated alphabetically organized AVS which I have not found useful in most complex patients, especially with respiratory illnesses  Please see AVS for specific instructions unique to this visit that I personally wrote and verbalized to the the pt in detail and then reviewed with pt  by my nurse highlighting any  changes in therapy recommended at today's visit to their plan of care.

## 2018-09-10 ENCOUNTER — Ambulatory Visit (INDEPENDENT_AMBULATORY_CARE_PROVIDER_SITE_OTHER): Payer: Medicare Other | Admitting: Internal Medicine

## 2018-09-10 ENCOUNTER — Telehealth: Payer: Self-pay | Admitting: Internal Medicine

## 2018-09-10 ENCOUNTER — Encounter: Payer: Self-pay | Admitting: Internal Medicine

## 2018-09-10 ENCOUNTER — Other Ambulatory Visit: Payer: Self-pay

## 2018-09-10 DIAGNOSIS — M069 Rheumatoid arthritis, unspecified: Secondary | ICD-10-CM | POA: Diagnosis not present

## 2018-09-10 DIAGNOSIS — R05 Cough: Secondary | ICD-10-CM

## 2018-09-10 DIAGNOSIS — F17210 Nicotine dependence, cigarettes, uncomplicated: Secondary | ICD-10-CM | POA: Diagnosis not present

## 2018-09-10 DIAGNOSIS — J449 Chronic obstructive pulmonary disease, unspecified: Secondary | ICD-10-CM

## 2018-09-10 DIAGNOSIS — R058 Other specified cough: Secondary | ICD-10-CM

## 2018-09-10 NOTE — Patient Instructions (Addendum)
For drainage / throat tickle/ night time cough hold the 4th dose of hydroxyzine and try take CHLORPHENIRAMINE  4 mg  (Chlortab 4mg   at [employer]  should be easiest to find in the green box)   1 or 2 an hour before bedtime   If breathing / cough / arthritis allow you should try taper prednisone to 10 mg alternating with 5 mg per day to minimize your need for systemic steroids especially if you back pain proves to be related to osteoporosis from chronic prednisone use.    Call your PCP re your new midline back pain which sounds like a compression fracture of your T spine due to long term  prednisone use    Keep working on:  stop smoking completely before smoking completely stops you!   Please schedule a follow up visit in 3 months but call sooner if needed  with all medications /inhalers/ solutions in hand so we can verify exactly what you are taking. This includes all medications from all doctors and over the counters

## 2018-09-10 NOTE — Assessment & Plan Note (Signed)
Onset early 2019  Sinus CT 10/17/2017 >>>  Acute L max sinusitis > augmentin x 20 days then ov 21 days with sinus CT > resolved but still coughing - gabapentin 100  Mg tid trial  Plus prn  1st gen H1 blockers> could not tol gabapentin / cough better s gerd rx as of 01/25/2018 > flared off pred 05/29/18 resumed prednisone 20 ceiling and 5 mg daily  - Allergy profile 07/30/2018 >  Eos 0.0 /  IgE  34 RAST neg  Will rechallenge with 1st gen H1 blockers per guidelines  At hs only for now

## 2018-09-10 NOTE — Assessment & Plan Note (Signed)
Active smoker/ MZ  Spirometry 06/08/2017  FEV1 1.43 (36%)  Ratio 41 with classic curvature   - 06/08/2017   stiolto sample > did not tol due to muscle cramps in neck and shoulders - 06/28/2017  After extensive coaching inhaler device  effectiveness =    90% > try dulera 200 2bid and pred 20 mg daily until better then 10 mg daily > much better 07/07/2017  - PFT's  07/07/2017  FEV1 1.70 (44 % ) ratio 42  p 5 % improvement from saba p dulera 200 prior to study with DLCO  34/37 % corrects to 92  % for alv volume   - 07/07/2017 taper off pred x 2 weeks  - alpha one screening 07/07/2017  >  MZ level 98   - 08/24/2017   try add spiriva 2 respimat each am - 10/09/17 placed on daily prednisone   - 06/27/2018    Changed to bevespi req by insurance  - 07/30/2018  After extensive coaching inhaler device,  effectiveness =    90%    Remains steroid dep for RA and copd and has trouble with taper due to flare of either and sometimes both.  The goal with a chronic steroid dependent illness is always arriving at the lowest effective dose that controls the disease/symptoms and not accepting a set "formula" which is based on statistics or guidelines that don't always take into account patient  variability or the natural hx of the dz in every individual patient, which may well vary over time.  For now therefore I recommend the patient maintain  20 mg ceiling and a floor of 10 mg a/w 5 mg daily if can't get down to 5 mg s flair

## 2018-09-10 NOTE — Assessment & Plan Note (Signed)
Counseled re importance of smoking cessation but did not meet time criteria for separate billing   °
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
## 2018-09-10 NOTE — Assessment & Plan Note (Addendum)
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

## 2018-09-10 NOTE — Telephone Encounter (Signed)
Instructions from OV with MW 4/20: For drainage / throat tickle/ night time cough hold the 4th dose of hydroxyzine and try take CHLORPHENIRAMINE  4 mg  (Chlortab 4mg   at [employer]  should be easiest to find in the green box)   1 or 2 an hour before bedtime   If breathing / cough / arthritis allow you should try taper prednisone to 10 mg alternating with 5 mg per day to minimize your need for systemic steroids especially if you back pain proves to be related to osteoporosis from chronic prednisone use.    Call your PCP re your new midline back pain which sounds like a compression fracture of your T spine due to long term  prednisone use    Keep working on:  stop smoking completely before smoking completely stops you!   Please schedule a follow up visit in 3 months but call sooner if needed  with all medications /inhalers/ solutions in hand so we can verify exactly what you are taking. This includes all medications from all doctors and over the counters     Called and spoke with pt stating to him from OV with MW that he said to contact PCP in regards to back pain. Pt expressed understanding.nothing further needed.

## 2018-11-09 IMAGING — CT CT PARANASAL SINUSES LIMITED
1 of 2 series · 11 of 14 positions shown, 14 images · non-contrast
Comparison: Screening CT of the paranasal sinuses 10/17/2017.

CLINICAL DATA: Upper airway cough syndrome.  Cough.

EXAM:
CT PARANASAL SINUS LIMITED WITHOUT CONTRAST
TECHNIQUE: Non-contiguous multidetector CT images of the paranasal sinuses were
obtained in a single plane without contrast.

[Series 4: limited sinus st · axial · 0.36mm/px · z∈[+1347,+1447]mm · 11 of 13 slices shown, 14 images]
[im 2/13  brain]
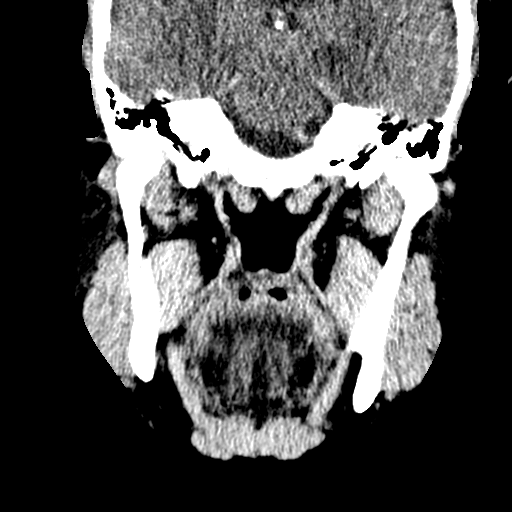
[im 2/13  bone]
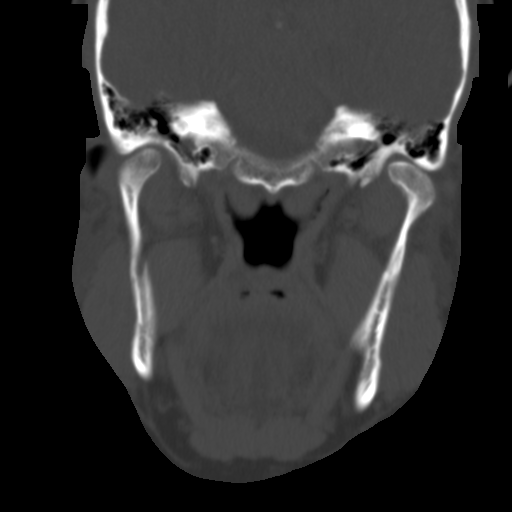
[im 3/13  bone]
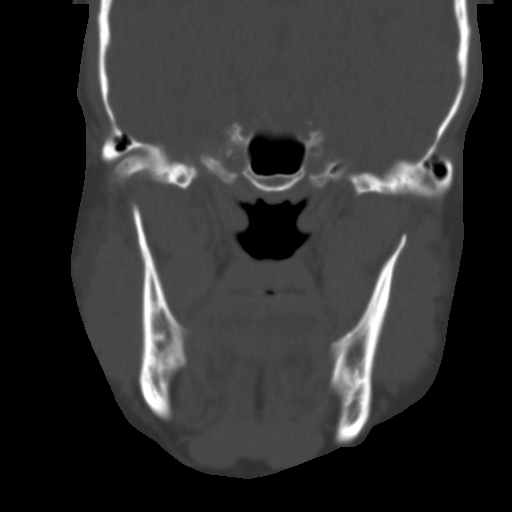
[im 4/13  bone]
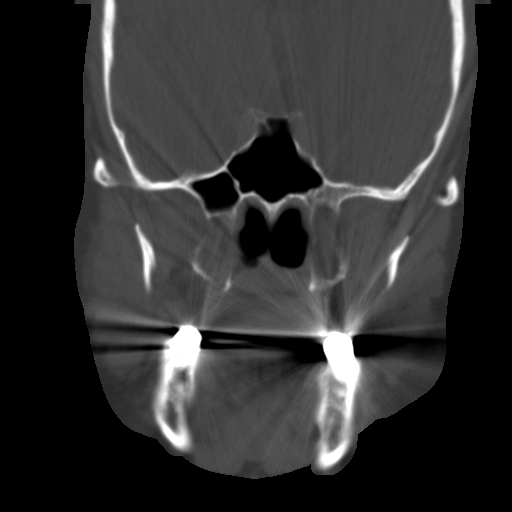
[im 5/13  bone]
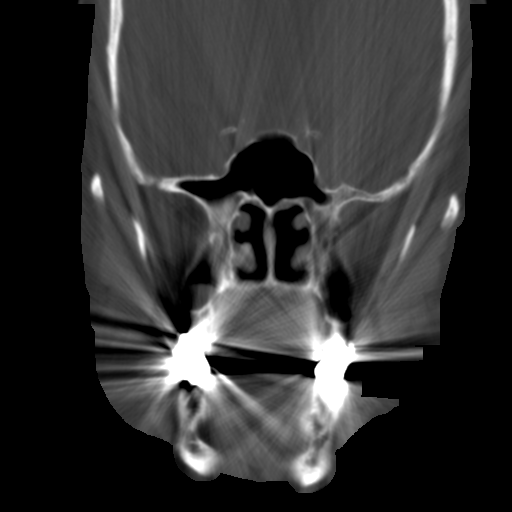
[im 6/13  brain]
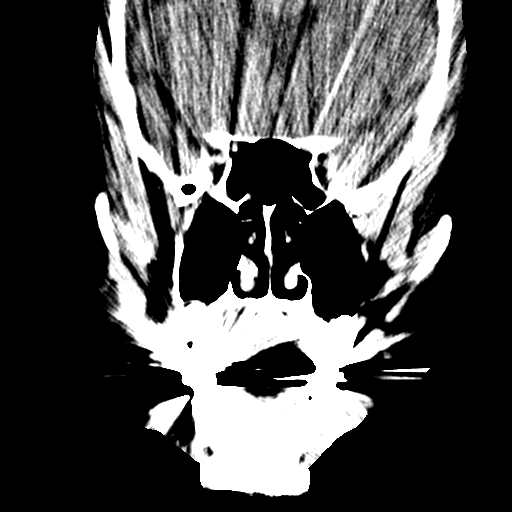
[im 6/13  bone]
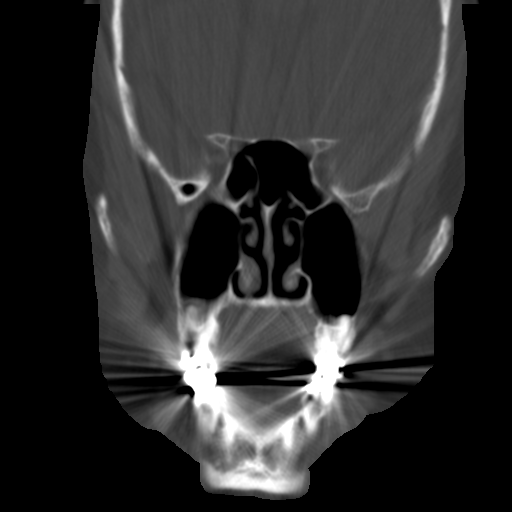
[im 7/13  bone]
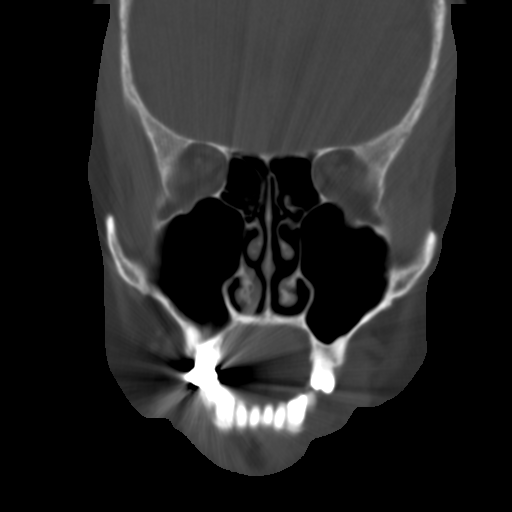
[im 8/13  bone]
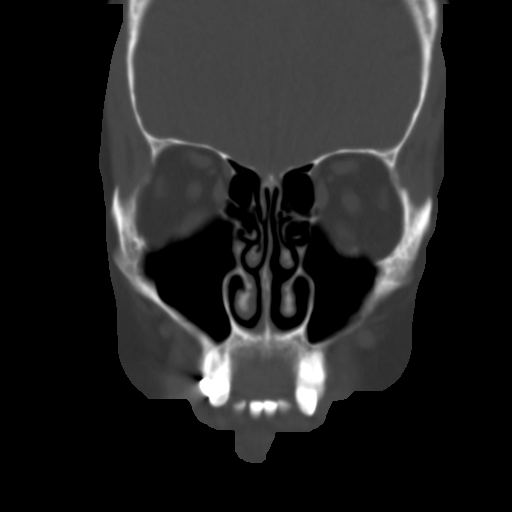
[im 9/13  bone]
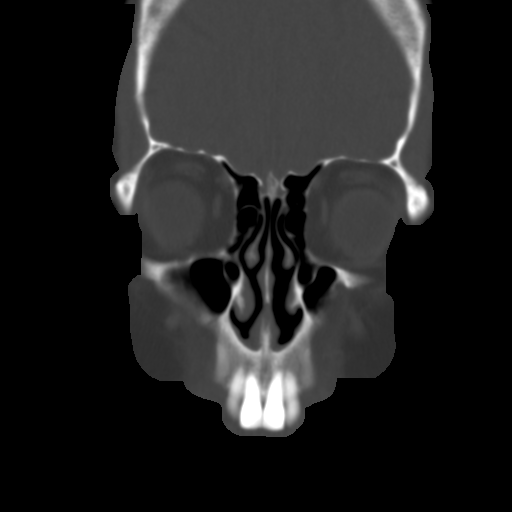
[im 10/13  brain]
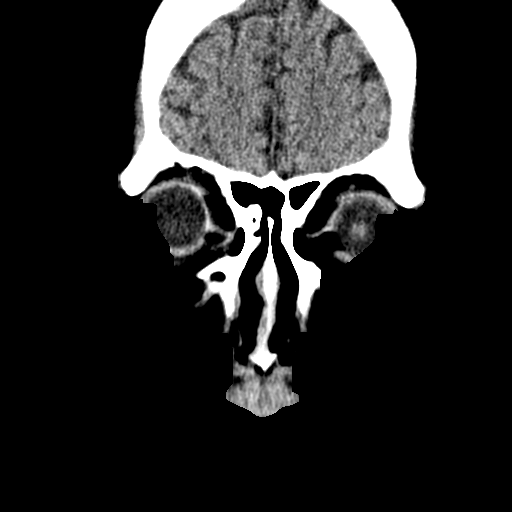
[im 10/13  bone]
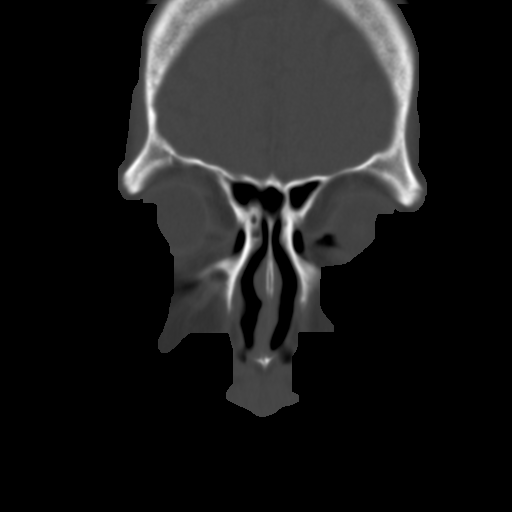
[im 11/13  bone]
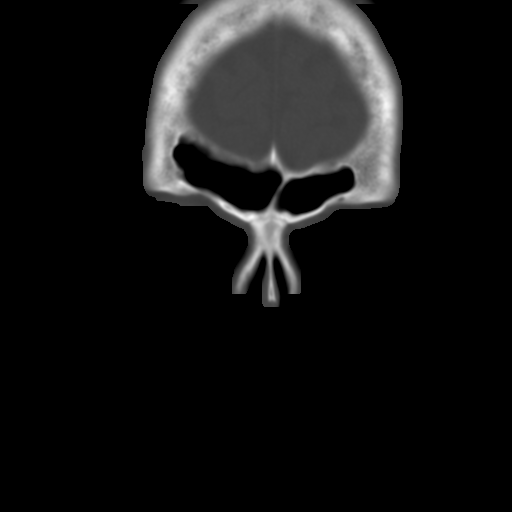
[im 12/13  bone]
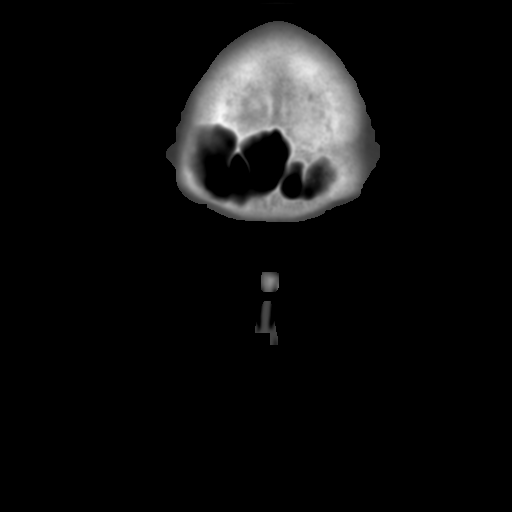

[11 of 14 positions shown; findings below may reference images not displayed]

FINDINGS: Previously noted left maxillary fluid level has resolved. No fluid
or mucosal thickening is present within the paranasal sinuses.

Limited imaging of the brain is unremarkable.
IMPRESSION: 1. Interval resolution of left maxillary fluid collection.
2. No acute or chronic sinus disease present.

## 2018-12-13 ENCOUNTER — Ambulatory Visit: Payer: Medicare Other | Admitting: Internal Medicine

## 2018-12-14 ENCOUNTER — Telehealth: Payer: Self-pay | Admitting: Internal Medicine

## 2018-12-14 MED ORDER — ALBUTEROL SULFATE HFA 108 (90 BASE) MCG/ACT IN AERS: INHALATION_SPRAY | RESPIRATORY_TRACT | 5 refills | Status: DC

## 2018-12-14 NOTE — Telephone Encounter (Signed)
Pt requesting 30 day supply of albuterol refill sent to [facility].  This has been sent in as requested.  Nothing further needed at this time- will close encounter.

## 2019-01-10 ENCOUNTER — Encounter: Payer: Self-pay | Admitting: Internal Medicine

## 2019-01-10 ENCOUNTER — Other Ambulatory Visit: Payer: Self-pay

## 2019-01-10 ENCOUNTER — Ambulatory Visit: Payer: Medicare Other | Admitting: Internal Medicine

## 2019-01-10 DIAGNOSIS — R05 Cough: Secondary | ICD-10-CM

## 2019-01-10 DIAGNOSIS — J449 Chronic obstructive pulmonary disease, unspecified: Secondary | ICD-10-CM | POA: Diagnosis not present

## 2019-01-10 DIAGNOSIS — R058 Other specified cough: Secondary | ICD-10-CM

## 2019-01-10 MED ORDER — STIOLTO RESPIMAT 2.5-2.5 MCG/ACT IN AERS: 2.0000 | INHALATION_SPRAY | Freq: Every day | RESPIRATORY_TRACT | 0 refills | Status: DC

## 2019-01-10 MED ORDER — STIOLTO RESPIMAT 2.5-2.5 MCG/ACT IN AERS: 2.0000 | INHALATION_SPRAY | Freq: Every day | RESPIRATORY_TRACT | 5 refills | Status: DC

## 2019-01-10 NOTE — Patient Instructions (Addendum)
Take chlorpheniramine 4 mg x 2 x one hour before bed to see if helps noct cough   Stop bevespi  Start stiolto x 2 puffs in am   Only use your albuterol as a rescue medication to be used if you can't catch your breath by resting or doing a relaxed purse lip breathing pattern.  - The less you use it, the better it will work when you need it. - Ok to use up to 2 puffs  every 4 hours if you must but call for immediate appointment if use goes up over your usual need - Don't leave home without it !!  (think of it like the spare tire for your car)    Discuss prednisone refills with pcp  Please schedule a follow up visit in 3 months but call sooner if needed  with all medications /inhalers/ solutions in hand so we can verify exactly what you are taking. This includes all medications from all doctors and over the counters

## 2019-01-10 NOTE — Assessment & Plan Note (Signed)
Onset early 2019  Sinus CT 10/17/2017 >>>  Acute L max sinusitis > augmentin x 20 days then ov 21 days with sinus CT > resolved but still coughing - gabapentin 100  Mg tid trial  Plus prn  1st gen H1 blockers> could not tol gabapentin / cough better s gerd rx as of 01/25/2018 > flared off pred 05/29/18 resumed prednisone 20 ceiling and 5 mg daily  - Allergy profile 07/30/2018 >  Eos 0.0 /  IgE  34 RAST neg  Reviewed use of 1st gen H1 blockers per guidelines  > no changes needed   >>> f/u  Ov in 3 m with all meds in hand using a trust but verify approach to confirm accurate Medication  Reconciliation The principal here is that until we are certain that the  patients are doing what we've asked, it makes no sense to ask them to do more.    I had an extended discussion with the patient  reviewing all relevant studies completed to date and  lasting 15 to 20 minutes of a 25 minute visit  which included directly observing ambulatory 02 saturation study documented in a/p section of  today's  office note.  I performed device teaching  using a teach back technique which also  extended face to face time for this visit (see above)   Each maintenance medication was reviewed in detail including most importantly the difference between maintenance and prns and under what circumstances the prns are to be triggered using an action plan format that is not reflected in the computer generated alphabetically organized AVS.     Please see AVS for specific instructions unique to this visit that I personally wrote and verbalized to the the pt in detail and then reviewed with pt  by my nurse highlighting any changes in therapy recommended at today's visit .
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
## 2019-01-10 NOTE — Assessment & Plan Note (Signed)
Active smoker/ MZ  Spirometry 06/08/2017  FEV1 1.43 (36%)  Ratio 41 with classic curvature   - 06/08/2017   stiolto sample > did not tol due to muscle cramps in neck and shoulders - 06/28/2017  After extensive coaching inhaler device  effectiveness =    90% > try dulera 200 2bid and pred 20 mg daily until better then 10 mg daily > much better 07/07/2017  - PFT's  07/07/2017  FEV1 1.70 (44 % ) ratio 42  p 5 % improvement from saba p dulera 200 prior to study with DLCO  34/37 % corrects to 92  % for alv volume   - 07/07/2017 taper off pred x 2 weeks  - alpha one screening 07/07/2017  >  MZ level 98   - 08/24/2017   try add spiriva 2 respimat each am - 10/09/17 placed on daily prednisone   - 06/27/2018    Changed to bevespi req by insurance  - 01/10/2019  After extensive coaching inhaler device,  effectiveness =    90% with smi, try again to see if insurance will cover stiolto   -  01/10/2019   Walked RA x one lap =  approx 250 ft - stopped due to sob with sats 92% @ slow pace      Pt is Group B in terms of symptom/risk and laba/lama therefore appropriate rx at this point >>>  Lama/laba as long as req systemic prednisone anyway.

## 2019-01-14 ENCOUNTER — Telehealth: Payer: Self-pay | Admitting: Internal Medicine

## 2019-01-14 NOTE — Telephone Encounter (Signed)
PA submitted Covermymeds Key: [id number]  Your information has been submitted to [place]. To check for an updated outcome later, reopen this PA request from your dashboard. If [employer] has not responded to your request within 24 hours, contact [place] at [phone]. If you think there may be a problem with your PA request, use our live chat feature at the bottom right.  Leave open to f/u on PA

## 2019-01-14 NOTE — Telephone Encounter (Signed)
[facility] pharmacy to confirm receipt of rx sent 8/20. Left pt a message notifying PA needed. Stiolto is requiring a PA

## 2019-01-15 NOTE — Telephone Encounter (Signed)
Donnell Durango (Key: [id number])  This request has received a Favorable outcome.  Please note any additional information provided by [employer] at the bottom of this request.

## 2019-01-15 NOTE — Telephone Encounter (Signed)
Message from Plan Your request has been approved  This approval authorizes your coverage from 10/16/2018 - 01/14/2020, unless we notify you otherwise, and as long as the following conditions apply: Stiolto Resp 2.5 mcg.

## 2019-01-15 NOTE — Telephone Encounter (Signed)
I called [facility] and spoke with the pharmacist about the PA. He states the Rx went through and the patient was notified. Nothing further is needed.

## 2019-01-21 IMAGING — DX DG CHEST 2V
2 series · 3 of 3 positions shown · non-contrast
Comparison: 11/13/2017.

CLINICAL DATA: Chest pain.

EXAM:
CHEST - 2 VIEW

[Series 1: chest pa · 0.14mm/px · 2 of 2 slices shown]
[im 1/2]
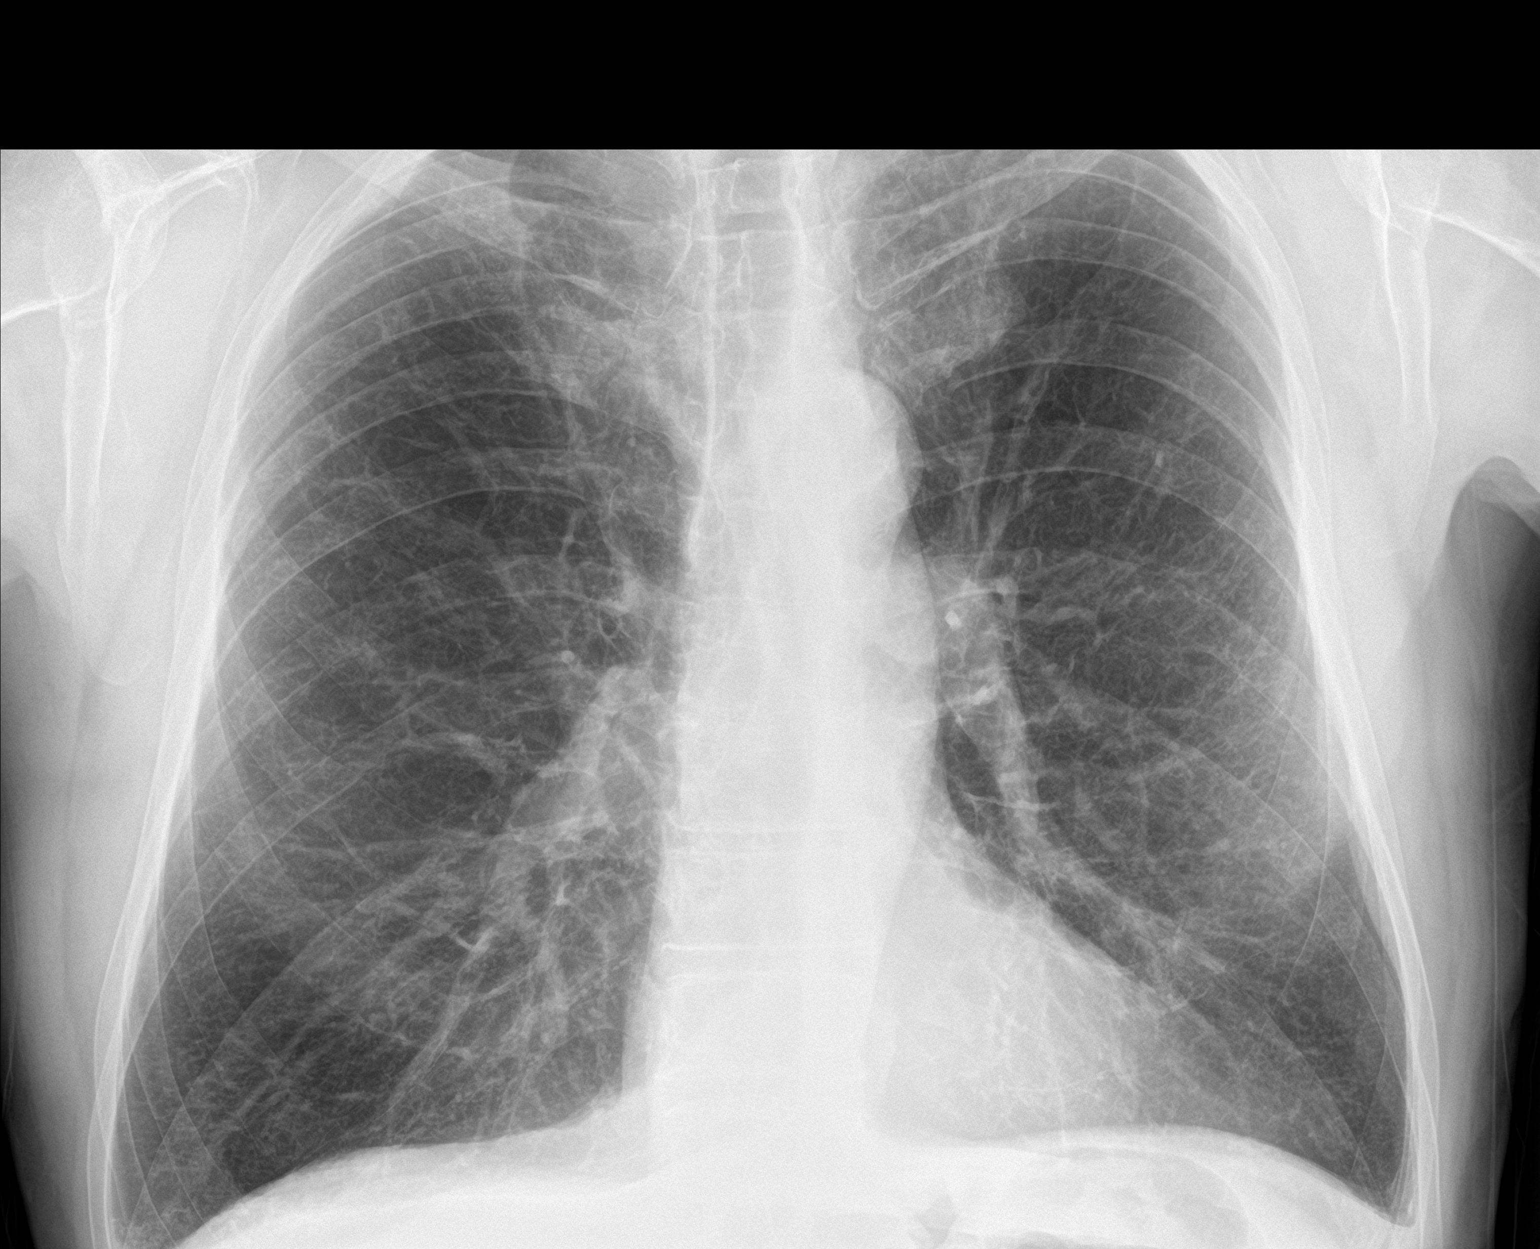
[im 2/2]
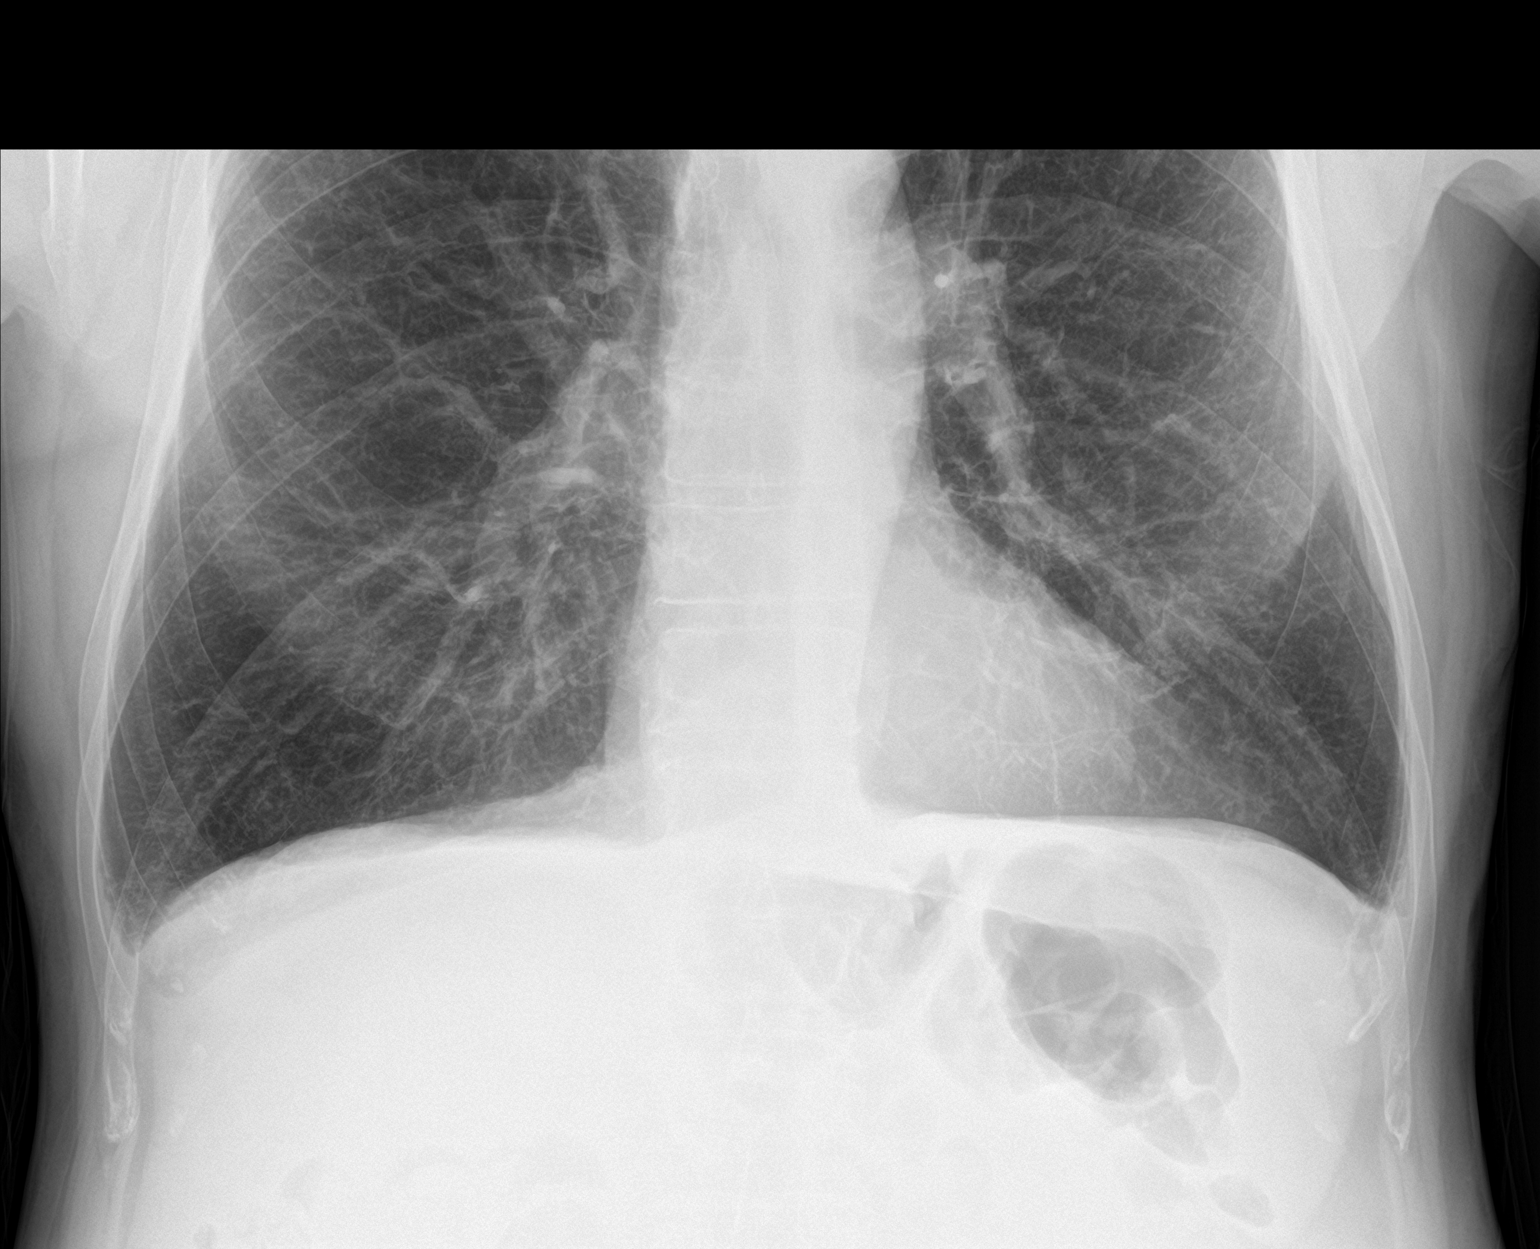

[chest lat]
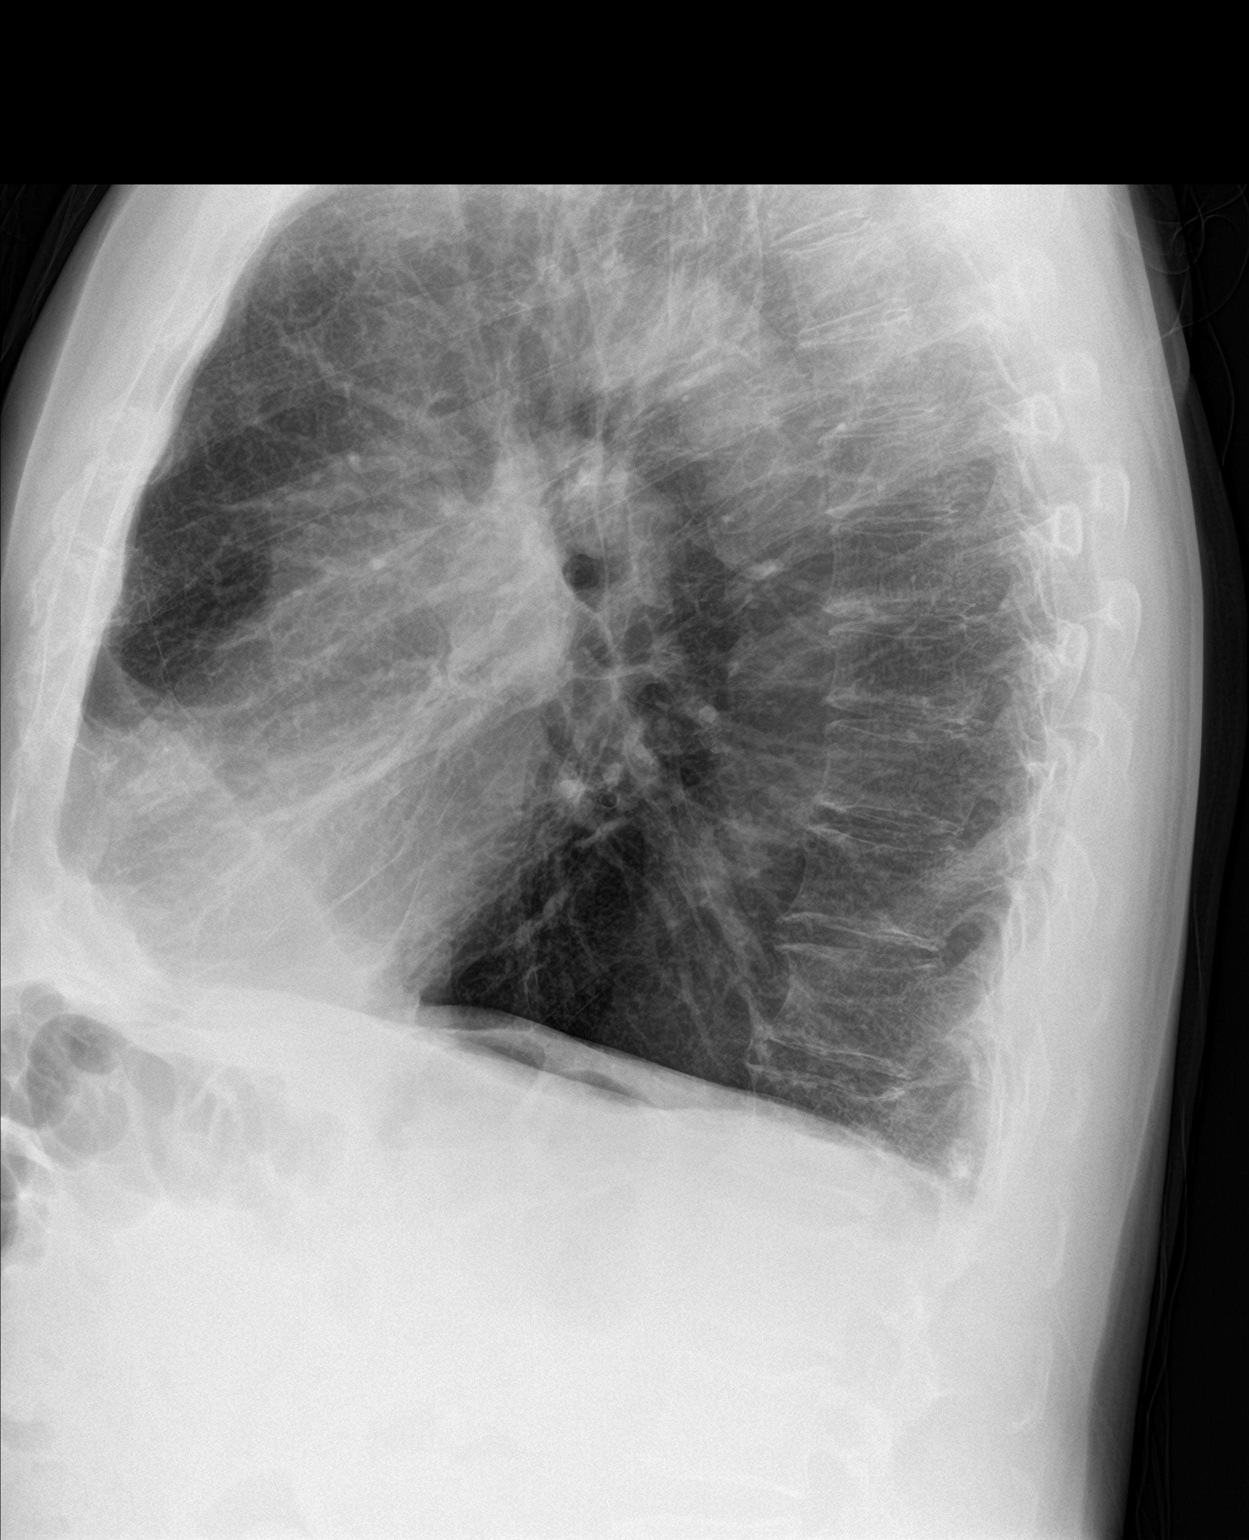

[3 of 3 positions shown; findings below may reference images not displayed]

FINDINGS: Mediastinum hilar structures normal. COPD. Lungs are clear of acute
infiltrates. Stable basilar thickening most consistent with scarring
again noted. No interim change. Right costophrenic angle
incompletely imaged. Heart size stable. No acute bony abnormality.
Degenerative change thoracic spine.
IMPRESSION: COPD. Stable mild basilar pleural thickening most consistent with
scarring. Chest is unchanged from prior exam.

## 2019-04-12 ENCOUNTER — Ambulatory Visit: Payer: Medicare Other | Admitting: Internal Medicine

## 2019-06-04 ENCOUNTER — Ambulatory Visit: Payer: Medicare Other | Admitting: Internal Medicine

## 2019-06-10 ENCOUNTER — Other Ambulatory Visit: Payer: Self-pay

## 2019-06-10 ENCOUNTER — Ambulatory Visit (INDEPENDENT_AMBULATORY_CARE_PROVIDER_SITE_OTHER): Payer: Medicare Other | Admitting: Internal Medicine

## 2019-06-10 ENCOUNTER — Encounter: Payer: Self-pay | Admitting: Internal Medicine

## 2019-06-10 DIAGNOSIS — F17210 Nicotine dependence, cigarettes, uncomplicated: Secondary | ICD-10-CM

## 2019-06-10 DIAGNOSIS — J449 Chronic obstructive pulmonary disease, unspecified: Secondary | ICD-10-CM | POA: Diagnosis not present

## 2019-06-10 MED ORDER — STIOLTO RESPIMAT 2.5-2.5 MCG/ACT IN AERS: 2.0000 | INHALATION_SPRAY | Freq: Every day | RESPIRATORY_TRACT | 11 refills | Status: DC

## 2019-06-10 NOTE — Patient Instructions (Signed)
Ok to leave prednisone at 5 mg or 5 mg every other day instead of taper off (or per your PCP)

## 2019-06-10 NOTE — Assessment & Plan Note (Signed)
Active smoker/ MZ  Spirometry 06/08/2017  FEV1 1.43 (36%)  Ratio 41 with classic curvature   - 06/08/2017   stiolto sample > did not tol due to muscle cramps in neck and shoulders - 06/28/2017  After extensive coaching inhaler device  effectiveness =    90% > try dulera 200 2bid and pred 20 mg daily until better then 10 mg daily > much better 07/07/2017  - PFT's  07/07/2017  FEV1 1.70 (44 % ) ratio 42  p 5 % improvement from saba p dulera 200 prior to study with DLCO  34/37 % corrects to 92  % for alv volume   - 07/07/2017 taper off pred x 2 weeks  - alpha one screening 07/07/2017  >  MZ level 98   - 08/24/2017   try add spiriva 2 respimat each am - 10/09/17 placed on daily prednisone   - 06/27/2018    Changed to bevespi req by insurance  - 01/10/2019  After extensive coaching inhaler device,  effectiveness =    90% with smi, try again to see if insurance will cover stiolto   -  01/10/2019   Walked RA x one lap =  approx 250 ft - stopped due to sob with sats 92% @ slow pace       Group D in terms of symptom/risk and laba/lama/ICS  therefore appropriate rx at this point >>>  Since needs prednisone anyway for RA just using systemic steroids for now and has been referred to rheum by PCP but no one available until March per pt  In meantime, The goal with a chronic steroid dependent illness is always arriving at the lowest effective dose that controls the disease/symptoms and not accepting a set "formula" which is based on statistics or guidelines that don't always take into account patient  variability or the natural hx of the dz in every individual patient, which may well vary over time.  For now therefore I recommend the patient maintain  20 mg per day until better as a ceiling nad 5 mg per day as a floor (don't taper completely off until told to do so by PCP or rheum.  Pt informed of the seriousness of COVID 19 infection as a direct risk to lung health  and safey and to close contacts and should continue to wear a  facemask in public and minimize exposure to public locations but especially avoid any area or activity where non-close contacts are not observing distancing or wearing an appropriate face mask.  I strongly recommended vaccine when offered.    Each maintenance medication was reviewed in detail including most importantly the difference between maintenance and as needed and under what circumstances the prns are to be used.  Please see AVS for specific  Instructions which are unique to this visit and I personally typed out  which were reviewed in detail over the phone with the patient and a copy provided via MyChart
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
## 2019-06-10 NOTE — Assessment & Plan Note (Signed)
Counseled re importance of smoking cessation but did not meet time criteria for separate billing   °

## 2019-06-21 ENCOUNTER — Telehealth: Payer: Self-pay | Admitting: Internal Medicine

## 2019-06-21 NOTE — Telephone Encounter (Signed)
Patient seen by Dr. Sherene Sires 06/10/19 for COPD GOLD III, cigarette smoker. The Stiolto Respimat was issued 06/10/19.  Sent to [facility].

## 2019-06-21 NOTE — Telephone Encounter (Signed)
Message sent to Lauren/Mandi to see if they have received a fax from the pharmacy requesting PA.

## 2019-06-27 NOTE — Telephone Encounter (Signed)
Attempted to call pt back but unable to reach. Left message for pt to return call. 

## 2019-06-27 NOTE — Telephone Encounter (Signed)
Patient is returning phone call.  Patient phone number is [phone].

## 2019-06-27 NOTE — Telephone Encounter (Signed)
Attempted to call pt but unable to reach. Left message for pt to return call. 

## 2019-06-27 NOTE — Telephone Encounter (Signed)
Would need ov to train on anoro if he's not used the elipta device and if not then give stiolto sample and set up ov with formulary in hand

## 2019-06-27 NOTE — Telephone Encounter (Signed)
Spoke with patient. He states he got a letter from his insurance stating his insurance will no longer cover Stiolto but will cover Anoro. Looking back patient hasn't tried this medication yet, Patient will do whatever Dr. Sherene Sires recommends  Dr. Sherene Sires please advise if we should move forward with PA for [facility] or send in RX for Anoro

## 2019-06-27 NOTE — Telephone Encounter (Signed)
Any correspondence on this?

## 2019-06-28 MED ORDER — ANORO ELLIPTA 62.5-25 MCG/INH IN AEPB: 1.0000 | INHALATION_SPRAY | Freq: Every day | RESPIRATORY_TRACT | 3 refills | Status: DC

## 2019-06-28 NOTE — Telephone Encounter (Signed)
Called pt and advised message from the provider. Pt understood and verbalized understanding. Nothing further is needed.   I called in Anoro to [facility].

## 2019-09-21 HISTORY — PX: KYPHOPLASTY: SHX5884

## 2019-12-03 ENCOUNTER — Other Ambulatory Visit: Payer: Self-pay | Admitting: Internal Medicine

## 2019-12-03 ENCOUNTER — Telehealth: Payer: Self-pay | Admitting: Internal Medicine

## 2019-12-03 MED ORDER — ANORO ELLIPTA 62.5-25 MCG/INH IN AEPB: 1.0000 | INHALATION_SPRAY | Freq: Every day | RESPIRATORY_TRACT | 0 refills | Status: DC

## 2019-12-03 NOTE — Telephone Encounter (Signed)
Called pt and made him an appt with Buelah Manis on 7/19 at 2:30pm. Pt is having back surgery and will run out of his Anoro before then. I refilled one month and scheduled appt. Nothing further is needed.

## 2019-12-05 ENCOUNTER — Other Ambulatory Visit: Payer: Self-pay

## 2019-12-05 ENCOUNTER — Encounter (HOSPITAL_COMMUNITY): Payer: Self-pay | Admitting: *Deleted

## 2019-12-05 ENCOUNTER — Emergency Department (HOSPITAL_COMMUNITY)
Admission: EM | Admit: 2019-12-05 | Discharge: 2019-12-05 | Disposition: A | Discharge status: Home / Self Care | Payer: Medicare Other | Attending: Emergency Medicine | Admitting: Emergency Medicine

## 2019-12-05 ENCOUNTER — Emergency Department (HOSPITAL_COMMUNITY): Payer: Medicare Other

## 2019-12-05 DIAGNOSIS — F17210 Nicotine dependence, cigarettes, uncomplicated: Secondary | ICD-10-CM | POA: Insufficient documentation

## 2019-12-05 DIAGNOSIS — Z20822 Contact with and (suspected) exposure to covid-19: Secondary | ICD-10-CM | POA: Insufficient documentation

## 2019-12-05 DIAGNOSIS — I10 Essential (primary) hypertension: Secondary | ICD-10-CM | POA: Diagnosis not present

## 2019-12-05 DIAGNOSIS — Z79899 Other long term (current) drug therapy: Secondary | ICD-10-CM | POA: Insufficient documentation

## 2019-12-05 DIAGNOSIS — M5416 Radiculopathy, lumbar region: Secondary | ICD-10-CM | POA: Insufficient documentation

## 2019-12-05 DIAGNOSIS — M545 Low back pain: Secondary | ICD-10-CM | POA: Diagnosis present

## 2019-12-05 LAB — SARS CORONAVIRUS 2 BY RT PCR (HOSPITAL ORDER, PERFORMED IN ~~LOC~~ HOSPITAL LAB): SARS Coronavirus 2: NEGATIVE

## 2019-12-05 MED ORDER — HYDROMORPHONE HCL 1 MG/ML IJ SOLN
1.0000 mg | Freq: Once | INTRAMUSCULAR | Status: AC
  Administered 2019-12-05: 1 mg via INTRAMUSCULAR
  Filled 2019-12-05: qty 1

## 2019-12-05 MED ORDER — KETOROLAC TROMETHAMINE 30 MG/ML IJ SOLN
30.0000 mg | Freq: Once | INTRAMUSCULAR | Status: AC
  Administered 2019-12-05: 30 mg via INTRAMUSCULAR
  Filled 2019-12-05: qty 1

## 2019-12-05 MED ORDER — OXYCODONE-ACETAMINOPHEN 5-325 MG PO TABS
2.0000 | ORAL_TABLET | Freq: Once | ORAL | Status: AC
  Administered 2019-12-05: 2 via ORAL
  Filled 2019-12-05: qty 2

## 2019-12-05 MED ORDER — ONDANSETRON 4 MG PO TBDP
4.0000 mg | ORAL_TABLET | Freq: Once | ORAL | Status: AC
  Administered 2019-12-05: 4 mg via ORAL
  Filled 2019-12-05: qty 1

## 2019-12-05 MED ORDER — OXYCODONE-ACETAMINOPHEN 5-325 MG PO TABS: 1.0000 | ORAL_TABLET | ORAL | 0 refills | Status: DC | PRN

## 2019-12-05 NOTE — ED Triage Notes (Signed)
Severe back pain, states he crushed a vertebrae in April and has difficulty getting follow up care

## 2019-12-05 NOTE — Discharge Instructions (Addendum)
As discussed, Dr Franky Macho will see you tomorrow morning in his office in anticipation of having surgery tomorrow.  Do not eat or drink anything tomorrow when you wake.  Call his office first thing tomorrow morning for an appointment time.  You may take the oxycodone medications given - do not drive within 4 hours of taking this medicine.  You should have a driver come with you to your appointment tomorrow.

## 2019-12-05 NOTE — ED Provider Notes (Signed)
[facility] EMERGENCY DEPARTMENT Provider Note   CSN: [id number] Arrival date & time: 12/05/19  1026     History Chief Complaint  Patient presents with   Back Pain    Robert Pugh is a 70 y.o. male with a history as outlined below including hypertension, GERD and rheumatoid arthritis, under the care of pain management in [place] (Dr. Donovan Kail for rheumatoid arthritis) who was diagnosed with a L4 compression fracture in April and underwent vertebroplasty in [place] after which he reports escalating low back pain with worsening radiation of pain in his right leg, now with weakness and difficulty ambulating do to these symptoms.  He is currently using a cane to help with ambulation.  He had an MRI after his kyphoplasty procedure which suggested there was an extrusion of the cement from this procedure causing his new symptoms.  He has been referred to an orthopedic surgeon in [place] who told him he needed a CT myelogram.  He has been waiting 6 weeks for this procedure and continues to have increased pain and weakness.  He has been unable to contact this provider although he is recently been called in a prednisone taper and is currently taking 80 mg daily.  He denies urinary or fecal incontinence or retention but does endorse the need for frequent urination.  He denies fevers or chills, no dysuria.  He has found no alleviators for symptoms.  He presents here as he woke today unable to bear weight on his right leg without severe pain but also weakness and has been unable to walk secondary to the symptoms.  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    Past Surgical History:  Procedure Laterality Date   APPENDECTOMY  09/2016   HERNIA REPAIR Right 2006   KNEE SURGERY Left    Left shoulder repair      plural infusion  1991   ROTATOR CUFF REPAIR Right        Family History  Problem Relation Age of Onset   Diabetes Mother    Multiple sclerosis Mother    Heart disease Father    Congestive Heart Failure Father    Esophageal cancer Neg Hx    Colon cancer Neg Hx    Pancreatic cancer Neg Hx    Prostate cancer Neg Hx     Social History   Tobacco Use   Smoking status: Current Some Day Smoker    Packs/day: 0.25    Years: 40.00    Pack years: 10.00    Types: Cigarettes   Smokeless tobacco: Never Used   Tobacco comment: 4-5 packs in 3 months  Vaping Use   Vaping Use: Never used  Substance Use Topics   Alcohol use: Not Currently   Drug use: Not on file    Home Medications Prior to Admission medications   Medication Sig Start  Date End Date Taking? Authorizing Provider  albuterol (PROAIR HFA) 108 (90 Base) MCG/ACT inhaler 2 puffs every 4 hours as needed only  if your can't catch your breath 12/14/18   Nyoka Cowden, MD  cloNIDine (CATAPRES) 0.1 MG tablet Take 1 tablet (0.1 mg total) by mouth 2 (two) times daily. 01/26/18   Nyoka Cowden, MD  hydrOXYzine (ATARAX/VISTARIL) 25 MG tablet Take 1 tablet (25 mg total) by mouth 4 (four) times daily. 07/30/18   Nyoka Cowden, MD  oxyCODONE-acetaminophen (PERCOCET/ROXICET) 5-325 MG tablet Take 1 tablet by mouth every 4 (four) hours as needed for severe pain. 12/05/19   Kyleeann Cremeans, Raynelle Fanning, PA-C  predniSONE (DELTASONE) 10 MG tablet 2 daily until better the 1 daily x 5 days and then one half daily Patient taking differently: Take 5 mg by mouth  daily. 2 daily until better the 1 daily x 5 days and then one half daily 07/30/18   Nyoka Cowden, MD  Tiotropium Bromide-Olodaterol (STIOLTO RESPIMAT) 2.5-2.5 MCG/ACT AERS Inhale 2 puffs into the lungs daily. 06/10/19   Nyoka Cowden, MD  umeclidinium-vilanterol (ANORO ELLIPTA) 62.5-25 MCG/INH AEPB Inhale 1 puff into the lungs daily. 12/03/19   Nyoka Cowden, MD  VERAPAMIL HCL PO Take 120 mg by mouth daily.    [provider]    Allergies    Gabapentin  Review of Systems   Review of Systems  Constitutional: Negative for fever.  Respiratory: Negative for shortness of breath.   Cardiovascular: Negative for chest pain and leg swelling.  Gastrointestinal: Negative for abdominal distention, abdominal pain and constipation.  Genitourinary: Negative for difficulty urinating, dysuria, flank pain, frequency and urgency.  Musculoskeletal: Positive for back pain. Negative for gait problem and joint swelling.  Skin: Negative for rash.  Neurological: Positive for weakness and numbness.    Physical Exam Updated Vital Signs BP (!) 159/112 (BP Location: Left Arm)    Pulse 75    Temp 98.1 F (36.7 C) (Oral)    Resp 16    Ht 6\' 2"  (1.88 m)    Wt 84.4 kg    SpO2 96%    BMI 23.88 kg/m   Physical Exam Vitals and nursing note reviewed.  Constitutional:      Appearance: He is well-developed.  HENT:     Head: Normocephalic.  Eyes:     Conjunctiva/sclera: Conjunctivae normal.  Cardiovascular:     Rate and Rhythm: Normal rate.     Comments: Pedal pulses normal. Pulmonary:     Effort: Pulmonary effort is normal.  Abdominal:     General: Bowel sounds are normal. There is no distension.     Palpations: Abdomen is soft. There is no mass.  Musculoskeletal:        General: Normal range of motion.     Cervical back: Normal range of motion and neck supple.     Lumbar back: Tenderness present. No swelling, edema or spasms.  Skin:    General: Skin is warm and dry.  Neurological:     Mental  Status: He is alert.     Sensory: Sensory deficit present.     Motor: No tremor or atrophy.     Gait: Gait abnormal.     Deep Tendon Reflexes: Babinski sign absent on the right side. Babinski sign absent on the left side.     Reflex Scores:      Patellar reflexes are 1+ on the right side and 2+ on the left side.  Achilles reflexes are 1+ on the right side and 2+ on the left side.    Comments: 4/5 strength in right hip and knee flexor and extensor muscle groups.  Ankle flexion and extension intact. Decreased sensation to fine touch right medial lower leg, right calf and posterior lower thigh.  Pt can stand using cane for support right leg, unable to step off with the right (pain, but also feels weak).      ED Results / Procedures / Treatments   Labs (all labs ordered are listed, but only abnormal results are displayed) Labs Reviewed  SARS CORONAVIRUS 2 BY RT PCR (HOSPITAL ORDER, PERFORMED IN [facility])    EKG None  Radiology MR LUMBAR SPINE WO CONTRAST  Result Date: 12/05/2019 CLINICAL DATA:  Severe low back pain. Right leg weakness history of a lumbar spine fracture April. No recent injury. EXAM: MRI LUMBAR SPINE WITHOUT CONTRAST TECHNIQUE: Multiplanar, multisequence MR imaging of the lumbar spine was performed. No intravenous contrast was administered. COMPARISON:  None. FINDINGS: Segmentation:  Standard. Alignment:  Maintained.  Straightening of lordosis noted. Vertebrae: No acute fracture, evidence of discitis, or bone lesion. The patient is status post L4 vertebral augmentation for a mild compression fracture. Vertebral body height loss is estimated at 25%. No retropulsion or involvement of the posterior elements. Conus medullaris and cauda equina: Conus extends to the T12-L1 level. Conus and cauda equina appear normal. Paraspinal and other soft tissues: Negative. Disc levels: T11-12 is imaged in the sagittal plane only and negative. T12-L1: Negative. L1-2: Minimal  disc bulge.  No stenosis. L2-3: Minimal disc bulge.  No stenosis. L3-4: Patient has a broad-based disc bulge. Also seen is an extruded and likely sequestered disc fragment in the right foramen measuring approximately 0.8 cm craniocaudal by 1.3 cm transverse by 0.6 cm AP which impinges on the exiting right L3 root. The left foramen is open. There is mild central canal and right subarticular recess narrowing at this level. L4-5: Mild-to-moderate facet degenerative change with a shallow disc bulge. No stenosis. L5-S1: Negative. IMPRESSION: Extruded and likely sequestered disc fragment in the right foramen at L3-4 impinges on the right exiting L3 root. Status post vertebral augmentation for mild L4 compression fracture. No complicating feature or acute bony abnormality. Electronically Signed   By: Drusilla Kanner M.D.   On: 12/05/2019 13:57    Procedures Procedures (including critical care time)  Medications Ordered in ED Medications  HYDROmorphone (DILAUDID) injection 1 mg (1 mg Intramuscular Given 12/05/19 1315)  ondansetron (ZOFRAN-ODT) disintegrating tablet 4 mg (4 mg Oral Given 12/05/19 1315)  HYDROmorphone (DILAUDID) injection 1 mg (1 mg Intramuscular Given 12/05/19 1513)  ketorolac (TORADOL) 30 MG/ML injection 30 mg (30 mg Intramuscular Given 12/05/19 1514)  oxyCODONE-acetaminophen (PERCOCET/ROXICET) 5-325 MG per tablet 2 tablet (2 tablets Oral Given 12/05/19 1609)    ED Course  I have reviewed the triage vital signs and the nursing notes.  Pertinent labs & imaging results that were available during my care of the patient were reviewed by me and considered in my medical decision making (see chart for details).  Clinical Course as of Dec 05 1722  Thu Dec 05, 2019  885 69 year old male with history of lumbar compression fracture status post kyphoplasty here with worsening back pain going down his leg.  Got some weakness in the right leg.  Getting an MRI.  Tried recheck to his neurosurgeon with  results.  Disposition per results of testing.   [MB]    Clinical  Course User Index [MB] Terrilee Files, MD   MDM Rules/Calculators/A&P                          Attempted to contact patients orthopedic back specialist Dr. Alesia Morin in [place].  Unable to talk to him, only able to leave voice mail with his assistant.  No call back.  Call placed to our neurosurgeon Dr Franky Macho for medical advise regarding safety of dispo home given the weakness and MRI findings.  Dr Franky Macho reviewed the MRI and advised he could safely be dispo'd home today but will need prompt f/u.  Offered OV tomorrow with anticipation of surgery tomorrow if pt is agreeable.  If so, npo in am, covid screening today.  Discussed with pt and his wife at bedside and they are very agreeable with this plan.  Advised npo per above, call Dr Franky Macho first thing am in anticipation of OV and probable surgical intervention.  Covid test collected.    Messaged Dr Franky Macho with pt's acceptance of his offer. Oxycodone prepack given for overnight sx relief.  Final Clinical Impression(s) / ED Diagnoses Final diagnoses:  Lumbar radiculopathy  Compression of lumbar nerve root    Rx / DC Orders ED Discharge Orders         Ordered    oxyCODONE-acetaminophen (PERCOCET/ROXICET) 5-325 MG tablet  Every 4 hours PRN     Discontinue  Reprint     12/05/19 1700           Burgess Amor, PA-C 12/05/19 1729    Terrilee Files, MD 12/05/19 [phone]

## 2019-12-06 ENCOUNTER — Other Ambulatory Visit: Payer: Self-pay | Admitting: Neurosurgery

## 2019-12-06 ENCOUNTER — Ambulatory Visit (HOSPITAL_COMMUNITY): Payer: Medicare Other

## 2019-12-06 ENCOUNTER — Ambulatory Visit (HOSPITAL_COMMUNITY): Payer: Medicare Other | Admitting: Certified Registered Nurse Anesthetist

## 2019-12-06 ENCOUNTER — Encounter (HOSPITAL_COMMUNITY): Payer: Self-pay | Admitting: Neurosurgery

## 2019-12-06 ENCOUNTER — Observation Stay (HOSPITAL_COMMUNITY)
Admission: RE | Admit: 2019-12-06 | Discharge: 2019-12-06 | Disposition: A | Discharge status: Home / Self Care | Payer: Medicare Other | Source: Ambulatory Visit | Attending: Neurosurgery | Admitting: Neurosurgery

## 2019-12-06 ENCOUNTER — Encounter (HOSPITAL_COMMUNITY)
Admission: RE | Disposition: A | Discharge status: Home / Self Care | Payer: Self-pay | Source: Ambulatory Visit | Attending: Neurosurgery

## 2019-12-06 ENCOUNTER — Other Ambulatory Visit: Payer: Self-pay

## 2019-12-06 DIAGNOSIS — F17210 Nicotine dependence, cigarettes, uncomplicated: Secondary | ICD-10-CM | POA: Diagnosis not present

## 2019-12-06 DIAGNOSIS — Z419 Encounter for procedure for purposes other than remedying health state, unspecified: Secondary | ICD-10-CM

## 2019-12-06 DIAGNOSIS — M5127 Other intervertebral disc displacement, lumbosacral region: Secondary | ICD-10-CM | POA: Diagnosis not present

## 2019-12-06 DIAGNOSIS — M5126 Other intervertebral disc displacement, lumbar region: Principal | ICD-10-CM | POA: Insufficient documentation

## 2019-12-06 DIAGNOSIS — Z79899 Other long term (current) drug therapy: Secondary | ICD-10-CM | POA: Insufficient documentation

## 2019-12-06 DIAGNOSIS — I10 Essential (primary) hypertension: Secondary | ICD-10-CM | POA: Insufficient documentation

## 2019-12-06 DIAGNOSIS — M79604 Pain in right leg: Secondary | ICD-10-CM | POA: Diagnosis present

## 2019-12-06 HISTORY — DX: Chronic obstructive pulmonary disease, unspecified: J44.9

## 2019-12-06 HISTORY — PX: LUMBAR LAMINECTOMY/DECOMPRESSION MICRODISCECTOMY: SHX5026

## 2019-12-06 LAB — CBC
HCT: 51.2 % (ref 39.0–52.0)
Hemoglobin: 15.6 g/dL (ref 13.0–17.0)
MCH: 21.6 pg — ABNORMAL LOW (ref 26.0–34.0)
MCHC: 30.5 g/dL (ref 30.0–36.0)
MCV: 71 fL — ABNORMAL LOW (ref 80.0–100.0)
Platelets: 398 10*3/uL (ref 150–400)
RBC: 7.21 MIL/uL — ABNORMAL HIGH (ref 4.22–5.81)
RDW: 18 % — ABNORMAL HIGH (ref 11.5–15.5)
WBC: 21.1 10*3/uL — ABNORMAL HIGH (ref 4.0–10.5)
nRBC: 0.1 % (ref 0.0–0.2)

## 2019-12-06 LAB — BASIC METABOLIC PANEL
Anion gap: 9 (ref 5–15)
BUN: 18 mg/dL (ref 8–23)
CO2: 23 mmol/L (ref 22–32)
Calcium: 8.7 mg/dL — ABNORMAL LOW (ref 8.9–10.3)
Chloride: 108 mmol/L (ref 98–111)
Creatinine, Ser: 0.89 mg/dL (ref 0.61–1.24)
GFR calc Af Amer: >60 mL/min (ref >60)
GFR calc non Af Amer: >60 mL/min (ref >60)
Glucose, Bld: 85 mg/dL (ref 70–99)
Potassium: 3.6 mmol/L (ref 3.5–5.1)
Sodium: 140 mmol/L (ref 135–145)

## 2019-12-06 LAB — SURGICAL PCR SCREEN
MRSA, PCR: NEGATIVE
Staphylococcus aureus: NEGATIVE

## 2019-12-06 SURGERY — LUMBAR LAMINECTOMY/DECOMPRESSION MICRODISCECTOMY 1 LEVEL
Anesthesia: General | Site: Spine Lumbar | Laterality: Right

## 2019-12-06 MED ORDER — KETOROLAC TROMETHAMINE 15 MG/ML IJ SOLN
7.5000 mg | Freq: Four times a day (QID) | INTRAMUSCULAR | Status: DC
  Administered 2019-12-06: 7.5 mg via INTRAVENOUS
  Filled 2019-12-06 (×2): qty 1

## 2019-12-06 MED ORDER — ORAL CARE MOUTH RINSE: 15.0000 mL | Freq: Once | OROMUCOSAL | Status: AC

## 2019-12-06 MED ORDER — LIDOCAINE-EPINEPHRINE 0.5 %-1:200000 IJ SOLN
INTRAMUSCULAR | Status: DC | PRN
  Administered 2019-12-06: 10 mL

## 2019-12-06 MED ORDER — ACETAMINOPHEN 325 MG PO TABS: 650.0000 mg | ORAL_TABLET | ORAL | Status: DC | PRN

## 2019-12-06 MED ORDER — ZOLPIDEM TARTRATE 5 MG PO TABS: 5.0000 mg | ORAL_TABLET | Freq: Every evening | ORAL | Status: DC | PRN

## 2019-12-06 MED ORDER — HYDROMORPHONE HCL 1 MG/ML IJ SOLN: 0.5000 mg | INTRAMUSCULAR | Status: DC | PRN

## 2019-12-06 MED ORDER — LACTATED RINGERS IV SOLN: INTRAVENOUS | Status: DC

## 2019-12-06 MED ORDER — ONDANSETRON HCL 4 MG PO TABS: 4.0000 mg | ORAL_TABLET | Freq: Four times a day (QID) | ORAL | Status: DC | PRN

## 2019-12-06 MED ORDER — CHLORHEXIDINE GLUCONATE 0.12 % MT SOLN
15.0000 mL | Freq: Once | OROMUCOSAL | Status: AC
  Administered 2019-12-06: 15 mL via OROMUCOSAL
  Filled 2019-12-06: qty 15

## 2019-12-06 MED ORDER — PANTOPRAZOLE SODIUM 40 MG PO TBEC: 80.0000 mg | DELAYED_RELEASE_TABLET | Freq: Every day | ORAL | Status: DC

## 2019-12-06 MED ORDER — MIDAZOLAM HCL 2 MG/2ML IJ SOLN
INTRAMUSCULAR | Status: AC
  Filled 2019-12-06: qty 2

## 2019-12-06 MED ORDER — SODIUM CHLORIDE 0.9% FLUSH: 3.0000 mL | Freq: Two times a day (BID) | INTRAVENOUS | Status: DC

## 2019-12-06 MED ORDER — HEMOSTATIC AGENTS (NO CHARGE) OPTIME
TOPICAL | Status: DC | PRN
  Administered 2019-12-06: 1 via TOPICAL

## 2019-12-06 MED ORDER — SUGAMMADEX SODIUM 200 MG/2ML IV SOLN
INTRAVENOUS | Status: DC | PRN
  Administered 2019-12-06: 200 mg via INTRAVENOUS

## 2019-12-06 MED ORDER — BUPIVACAINE HCL (PF) 0.5 % IJ SOLN
INTRAMUSCULAR | Status: AC
  Filled 2019-12-06: qty 30

## 2019-12-06 MED ORDER — MIDAZOLAM HCL 5 MG/5ML IJ SOLN
INTRAMUSCULAR | Status: DC | PRN
  Administered 2019-12-06: 2 mg via INTRAVENOUS

## 2019-12-06 MED ORDER — CEFAZOLIN SODIUM-DEXTROSE 2-3 GM-%(50ML) IV SOLR
INTRAVENOUS | Status: DC | PRN
  Administered 2019-12-06: 2 g via INTRAVENOUS

## 2019-12-06 MED ORDER — PHENOL 1.4 % MT LIQD: 1.0000 | OROMUCOSAL | Status: DC | PRN

## 2019-12-06 MED ORDER — THROMBIN 5000 UNITS EX SOLR
CUTANEOUS | Status: AC
  Filled 2019-12-06: qty 10000

## 2019-12-06 MED ORDER — THROMBIN 5000 UNITS EX SOLR
CUTANEOUS | Status: DC | PRN
  Administered 2019-12-06 (×2): 5000 [IU] via TOPICAL

## 2019-12-06 MED ORDER — FENTANYL CITRATE (PF) 100 MCG/2ML IJ SOLN
INTRAMUSCULAR | Status: DC | PRN
  Administered 2019-12-06: 100 ug via INTRAVENOUS

## 2019-12-06 MED ORDER — ALBUTEROL SULFATE HFA 108 (90 BASE) MCG/ACT IN AERS: 2.0000 | INHALATION_SPRAY | RESPIRATORY_TRACT | Status: DC | PRN

## 2019-12-06 MED ORDER — 0.9 % SODIUM CHLORIDE (POUR BTL) OPTIME
TOPICAL | Status: DC | PRN
  Administered 2019-12-06: 1000 mL

## 2019-12-06 MED ORDER — METHYLPREDNISOLONE ACETATE 80 MG/ML IJ SUSP
INTRAMUSCULAR | Status: AC
  Filled 2019-12-06: qty 1

## 2019-12-06 MED ORDER — MENTHOL 3 MG MT LOZG: 1.0000 | LOZENGE | OROMUCOSAL | Status: DC | PRN

## 2019-12-06 MED ORDER — FENTANYL CITRATE (PF) 100 MCG/2ML IJ SOLN
INTRAMUSCULAR | Status: DC | PRN
  Administered 2019-12-06: 50 ug via INTRAVENOUS
  Administered 2019-12-06: 100 ug via INTRAVENOUS
  Administered 2019-12-06 (×2): 50 ug via INTRAVENOUS

## 2019-12-06 MED ORDER — ACETAMINOPHEN 500 MG PO TABS: 1000.0000 mg | ORAL_TABLET | Freq: Once | ORAL | Status: AC

## 2019-12-06 MED ORDER — DEXAMETHASONE SODIUM PHOSPHATE 4 MG/ML IJ SOLN
INTRAMUSCULAR | Status: DC | PRN
  Administered 2019-12-06: 5 mg via INTRAVENOUS

## 2019-12-06 MED ORDER — ONDANSETRON HCL 4 MG/2ML IJ SOLN: 4.0000 mg | Freq: Four times a day (QID) | INTRAMUSCULAR | Status: DC | PRN

## 2019-12-06 MED ORDER — LABETALOL HCL 5 MG/ML IV SOLN
10.0000 mg | Freq: Once | INTRAVENOUS | Status: AC
  Administered 2019-12-06: 10 mg via INTRAVENOUS
  Filled 2019-12-06: qty 4

## 2019-12-06 MED ORDER — DIAZEPAM 5 MG PO TABS: 5.0000 mg | ORAL_TABLET | Freq: Four times a day (QID) | ORAL | Status: DC | PRN

## 2019-12-06 MED ORDER — PHENYLEPHRINE HCL (PRESSORS) 10 MG/ML IV SOLN
INTRAVENOUS | Status: DC | PRN
  Administered 2019-12-06: 80 ug via INTRAVENOUS

## 2019-12-06 MED ORDER — ALBUTEROL SULFATE HFA 108 (90 BASE) MCG/ACT IN AERS
INHALATION_SPRAY | RESPIRATORY_TRACT | Status: DC | PRN
  Administered 2019-12-06: 4 via RESPIRATORY_TRACT

## 2019-12-06 MED ORDER — CEFAZOLIN SODIUM-DEXTROSE 2-4 GM/100ML-% IV SOLN
INTRAVENOUS | Status: AC
  Filled 2019-12-06: qty 100

## 2019-12-06 MED ORDER — POTASSIUM CHLORIDE IN NACL 20-0.9 MEQ/L-% IV SOLN: INTRAVENOUS | Status: DC

## 2019-12-06 MED ORDER — ACETAMINOPHEN 650 MG RE SUPP: 650.0000 mg | RECTAL | Status: DC | PRN

## 2019-12-06 MED ORDER — BUPIVACAINE HCL 0.5 % IJ SOLN
INTRAMUSCULAR | Status: DC | PRN
  Administered 2019-12-06: 10 mL

## 2019-12-06 MED ORDER — OXYCODONE HCL 5 MG PO TABS
5.0000 mg | ORAL_TABLET | ORAL | Status: DC | PRN
  Administered 2019-12-06: 5 mg via ORAL
  Filled 2019-12-06: qty 1

## 2019-12-06 MED ORDER — LIDOCAINE HCL (CARDIAC) PF 100 MG/5ML IV SOSY
PREFILLED_SYRINGE | INTRAVENOUS | Status: DC | PRN
  Administered 2019-12-06: 40 mg via INTRAVENOUS

## 2019-12-06 MED ORDER — OXYCODONE HCL 5 MG PO TABS: 5.0000 mg | ORAL_TABLET | Freq: Four times a day (QID) | ORAL | 0 refills | Status: AC | PRN

## 2019-12-06 MED ORDER — SODIUM CHLORIDE 0.9% FLUSH: 3.0000 mL | INTRAVENOUS | Status: DC | PRN

## 2019-12-06 MED ORDER — TIZANIDINE HCL 4 MG PO TABS
4.0000 mg | ORAL_TABLET | Freq: Four times a day (QID) | ORAL | 0 refills | Status: DC | PRN
Start: 2019-12-06 — End: 2020-07-06

## 2019-12-06 MED ORDER — UMECLIDINIUM-VILANTEROL 62.5-25 MCG/INH IN AEPB: 1.0000 | INHALATION_SPRAY | Freq: Every day | RESPIRATORY_TRACT | Status: DC

## 2019-12-06 MED ORDER — SODIUM CHLORIDE 0.9 % IV SOLN: 250.0000 mL | INTRAVENOUS | Status: DC

## 2019-12-06 MED ORDER — FENTANYL CITRATE (PF) 250 MCG/5ML IJ SOLN
INTRAMUSCULAR | Status: AC
  Filled 2019-12-06: qty 5

## 2019-12-06 MED ORDER — LACTATED RINGERS IV SOLN: INTRAVENOUS | Status: DC | PRN

## 2019-12-06 MED ORDER — ACETAMINOPHEN 500 MG PO TABS
ORAL_TABLET | ORAL | Status: AC
  Administered 2019-12-06: 1000 mg via ORAL
  Filled 2019-12-06: qty 2

## 2019-12-06 MED ORDER — ROCURONIUM BROMIDE 100 MG/10ML IV SOLN
INTRAVENOUS | Status: DC | PRN
  Administered 2019-12-06: 70 mg via INTRAVENOUS

## 2019-12-06 MED ORDER — PROPOFOL 10 MG/ML IV BOLUS
INTRAVENOUS | Status: AC
  Filled 2019-12-06: qty 20

## 2019-12-06 MED ORDER — VERAPAMIL HCL 120 MG PO TABS
120.0000 mg | ORAL_TABLET | Freq: Every day | ORAL | Status: DC
  Filled 2019-12-06: qty 1

## 2019-12-06 MED ORDER — PROPOFOL 10 MG/ML IV BOLUS
INTRAVENOUS | Status: DC | PRN
  Administered 2019-12-06: 150 mg via INTRAVENOUS

## 2019-12-06 MED ORDER — METHYLPREDNISOLONE ACETATE 80 MG/ML IJ SUSP
INTRAMUSCULAR | Status: DC | PRN
  Administered 2019-12-06: 80 mg

## 2019-12-06 MED ORDER — OXYCODONE HCL ER 10 MG PO T12A: 10.0000 mg | EXTENDED_RELEASE_TABLET | Freq: Two times a day (BID) | ORAL | Status: DC

## 2019-12-06 MED ORDER — FENTANYL CITRATE (PF) 100 MCG/2ML IJ SOLN
INTRAMUSCULAR | Status: AC
  Filled 2019-12-06: qty 2

## 2019-12-06 MED ORDER — LIDOCAINE-EPINEPHRINE 0.5 %-1:200000 IJ SOLN
INTRAMUSCULAR | Status: AC
  Filled 2019-12-06: qty 1

## 2019-12-06 MED ORDER — CLONIDINE HCL 0.1 MG PO TABS
0.1000 mg | ORAL_TABLET | Freq: Two times a day (BID) | ORAL | Status: DC
  Filled 2019-12-06: qty 1

## 2019-12-06 MED ORDER — LABETALOL HCL 5 MG/ML IV SOLN
INTRAVENOUS | Status: AC
  Filled 2019-12-06: qty 4

## 2019-12-06 MED ORDER — FENTANYL CITRATE (PF) 100 MCG/2ML IJ SOLN
50.0000 ug | INTRAMUSCULAR | Status: AC
  Administered 2019-12-06: 50 ug via INTRAVENOUS

## 2019-12-06 MED ORDER — LABETALOL HCL 5 MG/ML IV SOLN
INTRAVENOUS | Status: DC | PRN
  Administered 2019-12-06: 5 mg via INTRAVENOUS

## 2019-12-06 MED FILL — Oxycodone w/ Acetaminophen Tab 5-325 MG: ORAL | Qty: 6 | Status: AC

## 2019-12-06 SURGICAL SUPPLY — 39 items
BAND RUBBER #18 3X1/16 STRL (MISCELLANEOUS) ×6 IMPLANT
BENZOIN TINCTURE PRP APPL 2/3 (GAUZE/BANDAGES/DRESSINGS) ×3 IMPLANT
BUR MATCHSTICK NEURO 3.0 LAGG (BURR) ×3 IMPLANT
BUR PRECISION FLUTE 5.0 (BURR) ×3 IMPLANT
CANISTER SUCT 3000ML PPV (MISCELLANEOUS) ×3 IMPLANT
CARTRIDGE OIL MAESTRO DRILL (MISCELLANEOUS) ×1 IMPLANT
CLOSURE WOUND 1/2 X4 (GAUZE/BANDAGES/DRESSINGS) ×1
DERMABOND ADVANCED (GAUZE/BANDAGES/DRESSINGS) ×2
DERMABOND ADVANCED .7 DNX12 (GAUZE/BANDAGES/DRESSINGS) ×1 IMPLANT
DIFFUSER DRILL AIR PNEUMATIC (MISCELLANEOUS) ×3 IMPLANT
DRAPE LAPAROTOMY 100X72X124 (DRAPES) ×3 IMPLANT
DRAPE MICROSCOPE LEICA (MISCELLANEOUS) ×3 IMPLANT
DRAPE SURG 17X23 STRL (DRAPES) ×3 IMPLANT
DURAPREP 26ML APPLICATOR (WOUND CARE) ×3 IMPLANT
ELECT REM PT RETURN 9FT ADLT (ELECTROSURGICAL) ×3
ELECTRODE REM PT RTRN 9FT ADLT (ELECTROSURGICAL) ×1 IMPLANT
GLOVE BIOGEL PI IND STRL 6.5 (GLOVE) ×1 IMPLANT
GLOVE BIOGEL PI IND STRL 7.0 (GLOVE) ×3 IMPLANT
GLOVE BIOGEL PI INDICATOR 6.5 (GLOVE) ×2
GLOVE BIOGEL PI INDICATOR 7.0 (GLOVE) ×6
GLOVE ECLIPSE 6.5 STRL STRAW (GLOVE) ×6 IMPLANT
GOWN STRL REUS W/ TWL LRG LVL3 (GOWN DISPOSABLE) ×3 IMPLANT
GOWN STRL REUS W/TWL LRG LVL3 (GOWN DISPOSABLE) ×6
KIT BASIN OR (CUSTOM PROCEDURE TRAY) ×3 IMPLANT
KIT TURNOVER KIT B (KITS) ×3 IMPLANT
NEEDLE HYPO 25X1 1.5 SAFETY (NEEDLE) ×3 IMPLANT
NS IRRIG 1000ML POUR BTL (IV SOLUTION) ×3 IMPLANT
OIL CARTRIDGE MAESTRO DRILL (MISCELLANEOUS) ×3
PACK LAMINECTOMY NEURO (CUSTOM PROCEDURE TRAY) ×3 IMPLANT
PAD ARMBOARD 7.5X6 YLW CONV (MISCELLANEOUS) ×9 IMPLANT
SPONGE SURGIFOAM ABS GEL SZ50 (HEMOSTASIS) ×3 IMPLANT
STRIP CLOSURE SKIN 1/2X4 (GAUZE/BANDAGES/DRESSINGS) ×2 IMPLANT
SUT VIC AB 0 CT1 18XCR BRD8 (SUTURE) ×1 IMPLANT
SUT VIC AB 0 CT1 8-18 (SUTURE) ×2
SUT VIC AB 2-0 CT1 18 (SUTURE) ×3 IMPLANT
SUT VIC AB 3-0 SH 8-18 (SUTURE) ×3 IMPLANT
TOWEL GREEN STERILE (TOWEL DISPOSABLE) ×3 IMPLANT
TOWEL GREEN STERILE FF (TOWEL DISPOSABLE) ×3 IMPLANT
WATER STERILE IRR 1000ML POUR (IV SOLUTION) ×3 IMPLANT

## 2019-12-06 NOTE — Anesthesia Procedure Notes (Signed)
Procedure Name: Intubation Date/Time: 12/06/2019 4:21 PM Performed by: Oletta Lamas, CRNA Pre-anesthesia Checklist: Patient identified, Emergency Drugs available, Suction available and Patient being monitored Patient Re-evaluated:Patient Re-evaluated prior to induction Oxygen Delivery Method: Circle System Utilized Preoxygenation: Pre-oxygenation with 100% oxygen Induction Type: IV induction Ventilation: Mask ventilation without difficulty Laryngoscope Size: Mac and 4 Grade View: Grade I Tube type: Oral Tube size: 7.5 mm Number of attempts: 1 Airway Equipment and Method: Stylet Placement Confirmation: ETT inserted through vocal cords under direct vision,  positive ETCO2 and breath sounds checked- equal and bilateral Secured at: 23 cm Tube secured with: Tape Dental Injury: Teeth and Oropharynx as per pre-operative assessment

## 2019-12-06 NOTE — Discharge Summary (Signed)
  Physician Discharge Summary  Patient ID: Robert Pugh MRN: [id number] DOB/AGE: [birth year] 69 y.o.  Admit date: 12/06/2019 Discharge date: 12/06/2019  Admission Diagnoses:hnp L3/4 right  Discharge Diagnoses: same Active Problems:   HNP (herniated nucleus pulposus), lumbar   Discharged Condition: good  Hospital Course: Robert Pugh was admitted and taken to the operating room for an uncomplicated foraminal discetomy on the right at L3/4. Post op he is voiding, ambulating, and tolerating a regular diet. Wound is clean, dry, and without signs of infection.  Treatments: surgery: right far lateral discetomy L3/4  Discharge Exam: Blood pressure (!) 167/99, pulse 78, temperature 98.4 F (36.9 C), resp. rate 20, height 6\' 2"  (1.88 m), weight 84.4 kg, SpO2 93 %. General appearance: alert, cooperative, appears stated age and no distress Weakness in hip flexors and quadriceps on the right Disposition: Discharge disposition: 01-Home or Self Care      Herniated lumbar disc  Allergies as of 12/06/2019      Reactions   Aspirin    stomach upset   Gabapentin    Arms and legs jerking, vision changes      Medication List    TAKE these medications   albuterol 108 (90 Base) MCG/ACT inhaler Commonly known as: ProAir HFA 2 puffs every 4 hours as needed only  if your can't catch your breath What changed:   how much to take  how to take this  when to take this  reasons to take this   Anoro Ellipta 62.5-25 MCG/INH Aepb Generic drug: umeclidinium-vilanterol Inhale 1 puff into the lungs daily.   cloNIDine 0.1 MG tablet Commonly known as: CATAPRES Take 1 tablet (0.1 mg total) by mouth 2 (two) times daily.   hydrOXYzine 25 MG tablet Commonly known as: ATARAX/VISTARIL Take 1 tablet (25 mg total) by mouth 4 (four) times daily.   omeprazole 40 MG capsule Commonly known as: PRILOSEC Take 40 mg by mouth daily.   oxyCODONE 5 MG immediate release tablet Commonly known as: Oxy  IR/ROXICODONE Take 1 tablet (5 mg total) by mouth every 6 (six) hours as needed for up to 8 days for moderate pain ((score 4 to 6)).   oxyCODONE-acetaminophen 5-325 MG tablet Commonly known as: PERCOCET/ROXICET Take 1 tablet by mouth every 4 (four) hours as needed for severe pain.   predniSONE 10 MG tablet Commonly known as: DELTASONE 2 daily until better the 1 daily x 5 days and then one half daily   Stiolto Respimat 2.5-2.5 MCG/ACT Aers Generic drug: Tiotropium Bromide-Olodaterol Inhale 2 puffs into the lungs daily.   tiZANidine 4 MG tablet Commonly known as: ZANAFLEX Take 1 tablet (4 mg total) by mouth every 6 (six) hours as needed for muscle spasms.   VERAPAMIL HCL PO Take 120 mg by mouth daily.       Follow-up Information    06-21-1985, MD Follow up in 3 week(s).   Specialty: Neurosurgery Why: please call the office to make an appointment Contact information: [address] [place] [phone]               Signed: [phone] 12/06/2019, 7:04 PM

## 2019-12-06 NOTE — Transfer of Care (Signed)
Immediate Anesthesia Transfer of Care Note  Patient: Robert Pugh  Procedure(s) Performed: Right Lumbar Three-Four Far Lateral Discectomy (Right Spine Lumbar)  Patient Location: PACU  Anesthesia Type:General  Level of Consciousness: awake, alert  and oriented  Airway & Oxygen Therapy: Patient Spontanous Breathing  Post-op Assessment: Report given to RN and Post -op Vital signs reviewed and stable  Post vital signs: Reviewed and stable  Last Vitals:  Vitals Value Taken Time  BP 147/90 12/06/19 1809  Temp    Pulse 71 12/06/19 1812  Resp 22 12/06/19 1812  SpO2 95 % 12/06/19 1812  Vitals shown include unvalidated device data.  Last Pain:  Vitals:   12/06/19 1503  TempSrc:   PainSc: 9       Patients Stated Pain Goal: 4 (12/06/19 1503)  Complications: No complications documented.

## 2019-12-06 NOTE — Progress Notes (Signed)
Elevated BP in Pre-Op: Dr. Renold Don notified about elevated BP, per patient, he took his 0.1 mg Clonidine this morning, took 120mg  Verapamil yesterday along with Clonidine; pt c/o 9/10 back pain.  Verbal order received for 50 mcg Fentanyl, may repeat x 1 if needed. Fentanyl administered at 1522. BP still elevated. Verbal order received from Dr. for 10mg  Labetalol.   12/06/19 1424 12/06/19 1430 12/06/19 1503  Vitals  Temp 98.8 F (37.1 C)  --   --   Temp Source Oral  --   --   Pulse Rate 99  --   --   Pulse Rate Source Monitor  --   --   Resp 18  --   --   BP (!) 210/127 (!) 180/124  --   SpO2 97 %  --   --   Oxygen Therapy  O2 Device Room Air  --   --   Height and Weight  Height 6\' 2"  (1.88 m)  --   --   Height Method Stated  --   --   Weight 84.4 kg  --   --   Weight Method Stated  --   --   BMI (Calculated) 23.87  --   --   BSA (Calculated - sq m) 2.1 sq meters  --   --   Pain Assessment  Pain Scale  --   --  0-10  Pain Score  --   --  9  Pain Type  --   --  Chronic pain  Pain Location  --   --  Back  Pain Orientation  --   --  Right;Lower  Pain Descriptors / Indicators  --   --  Sharp;Radiating (radiates to right leg)  Patients Stated Pain Goal  --   --  4  Pain Intervention(s)  --   --  RN made aware  LOC  Level of Consciousness  --   --  Alert  Orientation Level  --   --  Oriented X4    12/06/19 1512 12/06/19 1526 12/06/19 1531  Vitals  Temp  --   --   --   Temp Source  --   --   --   Pulse Rate  --  95 96  Pulse Rate Source  --   --   --   Resp  --   --   --   BP (!) 180/117 (!) 184/113 (!) 189/120  SpO2  --  95 % 94 %  Oxygen Therapy  O2 Device Room Air Room Air  --   Height and Weight  Height  --   --   --   Height Method  --   --   --   Weight  --   --   --   Weight Method  --   --   --   BMI (Calculated)  --   --   --   BSA (Calculated - sq m)  --   --   --   Pain Assessment  Pain Scale  --   --   --   Pain Score  --   --   --   Pain Type  --    --   --   Pain Location  --   --   --   Pain Orientation  --   --   --   Pain Descriptors / Indicators  --   --   --   Patients Stated  Pain Goal  --   --   --   Pain Intervention(s)  --   --   --   LOC  Level of Consciousness  --   --   --   Orientation Level  --   --   --     12/06/19 1536 12/06/19 1540 12/06/19 1555  Vitals  Temp  --   --   --   Temp Source  --   --   --   Pulse Rate 91 88 66  Pulse Rate Source  --   --   --   Resp  --   --   --   BP (!) 173/114 (!) 178/111 (!) 179/104  SpO2 95 % 95 % 95 %  Oxygen Therapy  O2 Device  --   --   --   Height and Weight  Height  --   --   --   Height Method  --   --   --   Weight  --   --   --   Weight Method  --   --   --   BMI (Calculated)  --   --   --   BSA (Calculated - sq m)  --   --   --   Pain Assessment  Pain Scale  --   --   --   Pain Score  --   --   --   Pain Type  --   --   --   Pain Location  --   --   --   Pain Orientation  --   --   --   Pain Descriptors / Indicators  --   --   --   Patients Stated Pain Goal  --   --   --   Pain Intervention(s)  --   --   --   LOC  Level of Consciousness  --   --   --   Orientation Level  --   --   --

## 2019-12-06 NOTE — Progress Notes (Signed)
Discharge instructions/education/AVS/Rx given to patient with wife at bedside and they both verbalized understanding. Pain is mild and much better than pre-op per patient. Patient voiding and emptying bladder well. Ambulated in the hallway with supervision with no difficulty. Incision site clean and dry. D/C via wheelchair with NT.

## 2019-12-06 NOTE — Discharge Instructions (Signed)
Lumbar Discectomy °Care After °A discectomy involves removal of discmaterial (the cartilage-like structures located between the bones of the back). It is done to relieve pressure on nerve roots. It can be used as a treatment for a back problem. The time in surgery depends on the findings in surgery and what is necessary to correct the problems. °HOME CARE INSTRUCTIONS  °· Check the cut (incision) made by the surgeon twice a day for signs of infection. Some signs of infection may include:  °· A foul smelling, greenish or yellowish discharge from the wound.  °· Increased pain.  °· Increased redness over the incision (operative) site.  °· The skin edges may separate.  °· Flu-like symptoms (problems).  °· A temperature above 101.5° F (38.6° C).  °· Change your bandages in about 24 to 36 hours following surgery or as directed.  °· You may shower tomrrow.  Avoid bathtubs, swimming pools and hot tubs for three weeks or until your incision has healed completely. °· Follow your doctor's instructions as to safe activities, exercises, and physical therapy.  °· Weight reduction may be beneficial if you are overweight.  °· Daily exercise is helpful to prevent the return of problems. Walking is permitted. You may use a treadmill without an incline. Cut down on activities and exercise if you have discomfort. You may also go up and down stairs as much as you can tolerate.  °· DO NOT lift anything heavier than 10 to 15 lbs. Avoid bending or twisting at the waist. Always bend your knees when lifting.  °· Maintain strength and range of motion as instructed.  °· Do not drive for 10 days, or as directed by your doctors. You may be a passenger . Lying back in the passenger seat may be more comfortable for you. Always wear a seatbelt.  °· Limit your sitting in a regular chair to 20 to 30 minutes at a time. There are no limitations for sitting in a recliner. You should lie down or walk in between sitting periods.  °· Only take  over-the-counter or prescription medicines for pain, discomfort, or fever as directed by your caregiver.  °SEEK MEDICAL CARE IF:  °· There is increased bleeding (more than a small spot) from the wound.  °· You notice redness, swelling, or increasing pain in the wound.  °· Pus is coming from wound.  °· You develop an unexplained oral temperature above 102° F (38.9° C) develops.  °· You notice a foul smell coming from the wound or dressing.  °· You have increasing pain in your wound.  °SEEK IMMEDIATE MEDICAL CARE IF:  °· You develop a rash.  °· You have difficulty breathing.  °· You develop any allergic problems to medicines given.  °Document Released: 04/13/2004 Document Revised: 04/28/2011 Document Reviewed: 08/02/2007 °ExitCare® Patient Information °

## 2019-12-06 NOTE — Op Note (Signed)
12/06/2019  6:19 PM  PATIENT:  Robert Pugh  69 y.o. male With weakness in the right hip flexors and quadriceps. MRI shows a foraminal disc at L3/4 on the right.  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 was taken to the operating room, intubated and placed under a general anesthetic without difficulty. He was positioned prone on a Wilson frame with all pressure points padded. His back was prepped and draped in a sterile manner. I opened the skin with a 10 blade and carried the dissection down to the thoracolumbar fascia. I used both sharp dissection and the monopolar cautery to expose the lamina of L3, and L2. I confirmed my location with an intraoperative xray.  I used the drill, Kerrison punches, and curettes to remove the lateral aspect of the L3 pars interarticularis.  I used the punches to remove bone and to removethe ligamentum flavum to expose the right L3 nerve root. I brought the microscope into the operative field and started the  decompression of the  L3 root(s).I used pituitary rongeurs, curettes, and other instruments to remove disc material inferior to the nerve root. Large pieces of disc and endplate Using hooks I teased pieces of disc out which were lodged under the root. After the discectomy was completed I inspected the L3 nerve root and felt it was well decompressed. I explored rostrally, laterally, medially, and caudally and was satisfied with the decompression. I irrigated the wound, then closed in layers. I approximated the thoracolumbar  fascia, subcutaneous, and subcuticular planes with vicryl sutures. I used dermabond for a sterile dressing.   PLAN OF CARE: Admit for overnight observation  PATIENT DISPOSITION:  PACU - hemodynamically stable.   Delay start of Pharmacological VTE agent (>24hrs) due to surgical blood loss or risk of bleeding:  yes

## 2019-12-06 NOTE — H&P (Signed)
Mr. Vantol presents with severe pain in the right lower extremity along with weakness. At this time he is needing a wheelchair. He presented to the [facility] ED last evening. An MRI was performed and revealed a large foraminal disc herniation at L3/4 on the right side compromising the right L3 nerve root. He comes in for operative decompression of the right L3 nerve root.  Allergies  Allergen Reactions  . Gabapentin     Arms and legs jerking, vision changes   Past Medical History:  Diagnosis Date  . Arthritis   . Bowel obstruction (HCC)   . Chronic headaches   . GERD (gastroesophageal reflux disease)   . High blood pressure   . Kidney stone   . Pneumonia    Past Surgical History:  Procedure Laterality Date  . APPENDECTOMY  09/2016  . HERNIA REPAIR Right 2006  . KNEE SURGERY Left   . Left shoulder repair     . plural infusion  1991  . ROTATOR CUFF REPAIR Right    Family History  Problem Relation Age of Onset  . Diabetes Mother   . Multiple sclerosis Mother   . Heart disease Father   . Congestive Heart Failure Father   . Esophageal cancer Neg Hx   . Colon cancer Neg Hx   . Pancreatic cancer Neg Hx   . Prostate cancer Neg Hx    Social History   Socioeconomic History  . Marital status: Married    Spouse name: Not on file  . Number of children: 2  . Years of education: Not on file  . Highest education level: Not on file  Occupational History  . Occupation: retired  Tobacco Use  . Smoking status: Current Some Day Smoker    Packs/day: 0.25    Years: 40.00    Pack years: 10.00    Types: Cigarettes  . Smokeless tobacco: Never Used  . Tobacco comment: 4-5 packs in 3 months  Vaping Use  . Vaping Use: Never used  Substance and Sexual Activity  . Alcohol use: Not Currently  . Drug use: Not on file  . Sexual activity: Not on file  Other Topics Concern  . Not on file  Social History Narrative  . Not on file   Social Determinants of Health   Financial Resource  Strain:   . Difficulty of Paying Living Expenses:   Food Insecurity:   . Worried About Programme researcher, broadcasting/film/video in the Last Year:   . Barista in the Last Year:   Transportation Needs:   . Freight forwarder (Medical):   Marland Kitchen Lack of Transportation (Non-Medical):   Physical Activity:   . Days of Exercise per Week:   . Minutes of Exercise per Session:   Stress:   . Feeling of Stress :   Social Connections:   . Frequency of Communication with Friends and Family:   . Frequency of Social Gatherings with Friends and Family:   . Attends Religious Services:   . Active Member of Clubs or Organizations:   . Attends Banker Meetings:   Marland Kitchen Marital Status:   Intimate Partner Violence:   . Fear of Current or Ex-Partner:   . Emotionally Abused:   Marland Kitchen Physically Abused:   . Sexually Abused:    Physical Exam Constitutional:      General: He is in acute distress.     Appearance: Normal appearance.  HENT:     Head: Normocephalic and atraumatic.  Right Ear: Tympanic membrane normal.     Left Ear: Tympanic membrane normal.     Nose: Nose normal.     Mouth/Throat:     Mouth: Mucous membranes are moist.     Pharynx: Oropharynx is clear.  Eyes:     Extraocular Movements: Extraocular movements intact.     Conjunctiva/sclera: Conjunctivae normal.     Pupils: Pupils are equal, round, and reactive to light.  Cardiovascular:     Rate and Rhythm: Normal rate and regular rhythm.     Pulses: Normal pulses.     Heart sounds: Normal heart sounds.  Pulmonary:     Breath sounds: Stridor present. Wheezing present.  Abdominal:     General: Abdomen is flat.     Palpations: Abdomen is soft.  Musculoskeletal:        General: Normal range of motion.     Cervical back: Normal range of motion and neck supple.  Skin:    General: Skin is warm and dry.  Neurological:     Mental Status: He is alert and oriented to person, place, and time.     Motor: Weakness present.     Gait: Gait  abnormal.     Deep Tendon Reflexes: Reflexes abnormal.  Psychiatric:        Mood and Affect: Mood normal.        Behavior: Behavior normal.        Thought Content: Thought content normal.        Judgment: Judgment normal.    BP (!) 180/124   Pulse 99   Temp 98.8 F (37.1 C) (Oral)   Resp 18   Ht 6\' 2"  (1.88 m)   Wt 84.4 kg   SpO2 97%   BMI 23.88 kg/m  Keyon Liller has decided to undergo a lumbar discetomy/decompression for a herniated disc at levels L3/4 right. Risks and benefits including but not limited to bleeding, infection, paralysis, weakness in one or both extremities, bowel and/or bladder dysfunction, need for further surgery, no relief of pain. Lessie Dings understands and wishes to proceed.

## 2019-12-06 NOTE — Anesthesia Preprocedure Evaluation (Signed)
Anesthesia Evaluation  Patient identified by MRN, date of birth, ID band Patient awake    Reviewed: Allergy & Precautions, NPO status , Patient's Chart, lab work & pertinent test results  Airway Mallampati: II  TM Distance: >3 FB Neck ROM: Full    Dental  (+) Teeth Intact, Dental Advisory Given   Pulmonary COPD, Current Smoker,    Pulmonary exam normal breath sounds clear to auscultation       Cardiovascular hypertension, Pt. on medications Normal cardiovascular exam Rhythm:Regular Rate:Normal     Neuro/Psych  Headaches, Herniated lumbar disc  negative psych ROS   GI/Hepatic Neg liver ROS, GERD  Medicated,  Endo/Other  negative endocrine ROS  Renal/GU negative Renal ROS     Musculoskeletal  (+) Arthritis ,   Abdominal   Peds  Hematology negative hematology ROS (+)   Anesthesia Other Findings Day of surgery medications reviewed with the patient.  Reproductive/Obstetrics                             Anesthesia Physical Anesthesia Plan  ASA: III  Anesthesia Plan: General   Post-op Pain Management:    Induction: Intravenous  PONV Risk Score and Plan: 2 and Midazolam, Dexamethasone and Ondansetron  Airway Management Planned: Oral ETT  Additional Equipment:   Intra-op Plan:   Post-operative Plan: Extubation in OR  Informed Consent: I have reviewed the patients History and Physical, chart, labs and discussed the procedure including the risks, benefits and alternatives for the proposed anesthesia with the patient or authorized representative who has indicated his/her understanding and acceptance.     Dental advisory given  Plan Discussed with: CRNA  Anesthesia Plan Comments:         Anesthesia Quick Evaluation

## 2019-12-06 NOTE — Anesthesia Postprocedure Evaluation (Signed)
Anesthesia Post Note  Patient: Robert Pugh  Procedure(s) Performed: Right Lumbar Three-Four Far Lateral Discectomy (Right Spine Lumbar)     Patient location during evaluation: PACU Anesthesia Type: General Level of consciousness: awake and alert Pain management: pain level controlled Vital Signs Assessment: post-procedure vital signs reviewed and stable Respiratory status: spontaneous breathing, nonlabored ventilation, respiratory function stable and patient connected to nasal cannula oxygen Cardiovascular status: blood pressure returned to baseline and stable Postop Assessment: no apparent nausea or vomiting Anesthetic complications: no   No complications documented.  Last Vitals:  Vitals:   12/06/19 1840 12/06/19 1858  BP: (!) 166/103 (!) 167/99  Pulse: 70 78  Resp: (!) 22 20  Temp: 36.9 C   SpO2: 94% 93%    Last Pain:  Vitals:   12/06/19 1936  TempSrc:   PainSc: 4                  Cecile Hearing

## 2019-12-07 ENCOUNTER — Encounter (HOSPITAL_COMMUNITY): Payer: Self-pay | Admitting: Neurosurgery

## 2019-12-09 ENCOUNTER — Other Ambulatory Visit: Payer: Self-pay

## 2019-12-09 ENCOUNTER — Telehealth: Payer: Self-pay | Admitting: Primary Care

## 2019-12-09 ENCOUNTER — Ambulatory Visit (INDEPENDENT_AMBULATORY_CARE_PROVIDER_SITE_OTHER): Payer: Medicare Other | Admitting: Primary Care

## 2019-12-09 ENCOUNTER — Telehealth: Payer: Self-pay

## 2019-12-09 ENCOUNTER — Encounter: Payer: Self-pay | Admitting: Primary Care

## 2019-12-09 DIAGNOSIS — J449 Chronic obstructive pulmonary disease, unspecified: Secondary | ICD-10-CM

## 2019-12-09 NOTE — Telephone Encounter (Signed)
Patient was seen today via televisit and his insurance per his chart is [employer]Marland Kitchen   lmtcb X1 for pt to ensure that we have the correct insurance info on file.

## 2019-12-09 NOTE — Patient Instructions (Addendum)
COPD: Stay prednisone 5mg  daily Continue Anoro one puffs daily; we will submit paper work for prior authorization or Stioloto  Follow-up: 3 months with Dr. 

## 2019-12-09 NOTE — Telephone Encounter (Signed)
PA started for [employer]  On  Cover my meds (Key: [id number] will follow up.
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
## 2019-12-11 NOTE — Telephone Encounter (Signed)
Spoke with the pt  He states that his insurance has not changed and he is still a member with [employer]  I do not see that a card is scanned into media  I called [employer]  They confirmed that he is not a member  Unable to do a review on his medication  Tried calling pt back and line was busy- need to verify insurance again

## 2019-12-13 NOTE — Telephone Encounter (Signed)
Spoke with the pt  He states no authorization needed and he was able to get his stiolto at pharm for only $30 copay  Nothing further needed

## 2019-12-13 NOTE — Telephone Encounter (Signed)
Pt called back:  Pt called his insurance and got "it straightened out" pt believes we were calling the wrong number. The correct number for insurance is [phone]  Pt requests a call back to further explain.

## 2019-12-13 NOTE — Telephone Encounter (Signed)
Patient contacted, he is going to call his insurance company today to see why they are saying he is not a client.

## 2020-01-31 ENCOUNTER — Encounter: Payer: Self-pay | Admitting: *Deleted

## 2020-06-11 ENCOUNTER — Other Ambulatory Visit: Payer: Self-pay | Admitting: Internal Medicine

## 2020-07-06 ENCOUNTER — Other Ambulatory Visit: Payer: Self-pay

## 2020-07-06 ENCOUNTER — Ambulatory Visit: Payer: Medicare Other | Admitting: Internal Medicine

## 2020-07-06 ENCOUNTER — Encounter: Payer: Self-pay | Admitting: Internal Medicine

## 2020-07-06 DIAGNOSIS — F17210 Nicotine dependence, cigarettes, uncomplicated: Secondary | ICD-10-CM

## 2020-07-06 DIAGNOSIS — J449 Chronic obstructive pulmonary disease, unspecified: Secondary | ICD-10-CM

## 2020-07-06 MED ORDER — BREZTRI AEROSPHERE 160-9-4.8 MCG/ACT IN AERO
2.0000 | INHALATION_SPRAY | Freq: Two times a day (BID) | RESPIRATORY_TRACT | 11 refills | Status: DC
Start: 2020-07-06 — End: 2021-02-09

## 2020-07-06 MED ORDER — BREZTRI AEROSPHERE 160-9-4.8 MCG/ACT IN AERO: 2.0000 | INHALATION_SPRAY | Freq: Two times a day (BID) | RESPIRATORY_TRACT | 0 refills | Status: DC

## 2020-07-06 NOTE — Assessment & Plan Note (Signed)
4-5 min discussion re active cigarette smoking in addition to office E&M  Ask about tobacco use:   ongoing Advise quitting   Advised re MZ status and the fact that 5 cigs is a lot given relative insufficiency of circulating alpha one AT Assess willingness:  Not committed at this point Assist in quit attempt:  Per PCP when ready Arrange follow up:   Follow up per Primary Care planned

## 2020-07-06 NOTE — Patient Instructions (Signed)
Plan A = Automatic = Always=  Breztri Take 2 puffs first thing in am and then another 2 puffs about 12 hours later.   Prednisone 10 mg one-half if doing well, one whole if not   Plan B = Backup (to supplement plan A, not to replace it) Only use your albuterol inhaler as a rescue medication to be used if you can't catch your breath by resting or doing a relaxed purse lip breathing pattern.  - The less you use it, the better it will work when you need it. - Ok to use the inhaler up to 2 puffs  every 4 hours if you must but call for appointment if use goes up over your usual need - Don't leave home without it !!  (think of it like the spare tire for your car)   Please schedule a follow up visit in 3 months but call sooner if needed in [place]
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
## 2020-07-06 NOTE — Assessment & Plan Note (Signed)
Active smoker/ MZ  Spirometry 06/08/2017  FEV1 1.43 (36%)  Ratio 41 with classic curvature   - 06/08/2017   stiolto sample > did not tol due to muscle cramps in neck and shoulders - 06/28/2017  After extensive coaching inhaler device  effectiveness =    90% > try dulera 200 2bid and pred 20 mg daily until better then 10 mg daily > much better 07/07/2017  - PFT's  07/07/2017  FEV1 1.70 (44 % ) ratio 42  p 5 % improvement from saba p dulera 200 prior to study with DLCO  34/37 % corrects to 92  % for alv volume   - 07/07/2017 taper off pred x 2 weeks  - alpha one screening 07/07/2017  >  MZ level 98   - 08/24/2017   try add spiriva 2 respimat each am - 10/09/17 placed on daily prednisone   - 06/27/2018    Changed to bevespi req by insurance  - 01/10/2019  After extensive coaching inhaler device,  effectiveness =    90% with smi, try again to see if insurance will cover stiolto   -  01/10/2019   Walked RA x one lap =  approx 250 ft - stopped due to sob with sats 92% @ slow pace    -  07/06/2020  After extensive coaching inhaler device,  effectiveness =    80% breztri Take 2 puffs first thing in am and then another 2 puffs about 12 hours later.     Group D in terms of symptom/risk and laba/lama/ICS  therefore appropriate rx at this point >>>  breztri approp if can access / intol of dpi  Re saba: I spent extra time with pt today reviewing appropriate use of albuterol for prn use on exertion with the following points: 1) saba is for relief of sob that does not improve by walking a slower pace or resting but rather if the pt does not improve after trying this first. 2) If the pt is convinced, as many are, that saba helps recover from activity faster then it's easy to tell if this is the case by re-challenging : ie stop, take the inhaler, then p 5 minutes try the exact same activity (intensity of workload) that just caused the symptoms and see if they are substantially diminished or not after saba 3) if there is an  activity that reproducibly causes the symptoms, try the saba 15 min before the activity on alternate days   If in fact the saba really does help, then fine to continue to use it prn but advised may need to look closer at the maintenance regimen being used to achieve better control of airways disease with exertion.

## 2021-02-09 ENCOUNTER — Ambulatory Visit: Payer: Medicare Other | Admitting: Internal Medicine

## 2021-02-09 ENCOUNTER — Encounter: Payer: Self-pay | Admitting: Internal Medicine

## 2021-02-09 ENCOUNTER — Other Ambulatory Visit: Payer: Self-pay

## 2021-02-09 VITALS — BP 140/88 | HR 84 | Temp 98.0°F | Ht 75.0 in | Wt 175.1 lb

## 2021-02-09 DIAGNOSIS — Z23 Encounter for immunization: Secondary | ICD-10-CM

## 2021-02-09 DIAGNOSIS — F17210 Nicotine dependence, cigarettes, uncomplicated: Secondary | ICD-10-CM

## 2021-02-09 DIAGNOSIS — J449 Chronic obstructive pulmonary disease, unspecified: Secondary | ICD-10-CM

## 2021-02-09 MED ORDER — BREZTRI AEROSPHERE 160-9-4.8 MCG/ACT IN AERO: INHALATION_SPRAY | RESPIRATORY_TRACT | 11 refills | Status: DC

## 2021-02-09 NOTE — Assessment & Plan Note (Signed)
Active smoker/ MZ  Spirometry 06/08/2017  FEV1 1.43 (36%)  Ratio 41 with classic curvature   - 06/08/2017   stiolto sample > did not tol due to muscle cramps in neck and shoulders - 06/28/2017  After extensive coaching inhaler device  effectiveness =    90% > try dulera 200 2bid and pred 20 mg daily until better then 10 mg daily > much better 07/07/2017  - PFT's  07/07/2017  FEV1 1.70 (44 % ) ratio 42  p 5 % improvement from saba p dulera 200 prior to study with DLCO  34/37 % corrects to 92  % for alv volume   - 07/07/2017 taper off pred x 2 weeks  - alpha one screening 07/07/2017  >  MZ level 98   - 08/24/2017   try add spiriva 2 respimat each am - 10/09/17 placed on daily prednisone   - 06/27/2018    Changed to bevespi req by insurance  - 01/10/2019  After extensive coaching inhaler device,  effectiveness =    90% with smi, try again to see if insurance will cover stiolto   -  01/10/2019   Walked RA x one lap =  approx 250 ft - stopped due to sob with sats 92% @ slow pace    -  07/06/2020  After extensive coaching inhaler device,  effectiveness =    80% breztri Take 2 puffs first thing in am and then another 2 puffs about 12 hours later.  - 02/09/2021 establish floor of 5 mg pred daily for copd/ 10 mg ceiling    Group D in terms of symptom/risk and laba/lama/ICS  therefore appropriate rx at this point >>>  breztri plus pred.  The goal with a chronic steroid dependent illness is always arriving at the lowest effective dose that controls the disease/symptoms and not accepting a set "formula" which is based on statistics or guidelines that don't always take into account patient  variability or the natural hx of the dz in every individual patient, which may well vary over time.  For now therefore I recommend the patient maintain  10 mg ceiling until better then 5 mg floor.  If this is not adequate will need to raise ceiling to 20 mg but leave floor at 5 mg for both AB and RA.

## 2021-02-09 NOTE — Assessment & Plan Note (Signed)
Counseled re importance of smoking cessation but did not meet time criteria for separate billing           Each maintenance medication was reviewed in detail including emphasizing most importantly the difference between maintenance and prns and under what circumstances the prns are to be triggered using an action plan format where appropriate.  Total time for H and P, chart review, counseling, reviewing how/ when to use different hfa device(s) and generating customized AVS unique to this office visit / same day charting = 25 min
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
## 2021-02-09 NOTE — Patient Instructions (Addendum)
Plan A = Automatic = Always=  Breztri Take 2 puffs first thing in am and then another 2 puffs about 12 hours later.   Prednisone 10 mg one-half if doing well, otherwise take a whole 10 mg daily until back to your normal breathing  Plan B = Backup (to supplement plan A, not to replace it) Only use your albuterol inhaler as a rescue medication to be used if you can't catch your breath by resting or doing a relaxed purse lip breathing pattern.  - The less you use it, the better it will work when you need it. - Ok to use the inhaler up to 2 puffs  every 4 hours if you must but call for appointment if use goes up over your usual need - Don't leave home without it !!  (think of it like the spare tire for your car)   Flu shot today   Please schedule a follow up visit in 3 months but call sooner if needed in [place]

## 2021-06-25 ENCOUNTER — Encounter: Payer: Self-pay | Admitting: Internal Medicine

## 2021-06-25 ENCOUNTER — Other Ambulatory Visit: Payer: Self-pay

## 2021-06-25 ENCOUNTER — Ambulatory Visit: Payer: Medicare Other | Admitting: Internal Medicine

## 2021-06-25 DIAGNOSIS — J449 Chronic obstructive pulmonary disease, unspecified: Secondary | ICD-10-CM

## 2021-06-25 DIAGNOSIS — F17210 Nicotine dependence, cigarettes, uncomplicated: Secondary | ICD-10-CM | POA: Diagnosis not present
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
## 2021-06-25 NOTE — Patient Instructions (Signed)
I will be referring you to Eric Form  NP for lung cancer screening   Please schedule a follow up visit in 6 months but call sooner if needed

## 2021-06-25 NOTE — Assessment & Plan Note (Addendum)
Counseled re importance of smoking cessation but did not meet time criteria for separate billing    Low-dose CT lung cancer screening is recommended for patients who are 3555-70 years of age with a 20+ pack-year history of smoking, and who are currently smoking or quit <=15 years ago. No coughing up blood  No unintentional weight loss of > 15 pounds in the last 6 months  >>> referred for early decision making          Each maintenance medication was reviewed in detail including emphasizing most importantly the difference between maintenance and prns and under what circumstances the prns are to be triggered using an action plan format where appropriate.  Total time for H and P, chart review, counseling, reviewing hfa device(s) and generating customized AVS unique to this office visit / same day charting =25 min

## 2021-06-26 ENCOUNTER — Encounter: Payer: Self-pay | Admitting: Internal Medicine

## 2021-06-26 NOTE — Assessment & Plan Note (Signed)
Active smoker/ MZ  Spirometry 06/08/2017  FEV1 1.43 (36%)  Ratio 41 with classic curvature   - 06/08/2017   stiolto sample > did not tol due to muscle cramps in neck and shoulders - 06/28/2017  After extensive coaching inhaler device  effectiveness =    90% > try dulera 200 2bid and pred 20 mg daily until better then 10 mg daily > much better 07/07/2017  - PFT's  07/07/2017  FEV1 1.70 (44 % ) ratio 42  p 5 % improvement from saba p dulera 200 prior to study with DLCO  34/37 % corrects to 92  % for alv volume   - 07/07/2017 taper off pred x 2 weeks  - alpha one screening 07/07/2017  >  MZ level 98   - 08/24/2017   try add spiriva 2 respimat each am - 10/09/17 placed on daily prednisone   - 06/27/2018    Changed to bevespi req by insurance  - 01/10/2019  After extensive coaching inhaler device,  effectiveness =    90% with smi, try again to see if insurance will cover stiolto   -  01/10/2019   Walked RA x one lap =  approx 250 ft - stopped due to sob with sats 92% @ slow pace    -  07/06/2020  After extensive coaching inhaler device,  effectiveness =    80% breztri Take 2 puffs first thing in am and then another 2 puffs about 12 hours later.  - 02/09/2021 establish floor of 5 mg pred daily for copd/ 10 mg ceiling     Group D in terms of symptom/risk and laba/lama/ICS  therefore appropriate rx at this point >>>  Continue breztri and minimal dose of pred that controls AB and RA

## 2021-10-11 ENCOUNTER — Other Ambulatory Visit: Payer: Self-pay

## 2021-10-11 DIAGNOSIS — Z122 Encounter for screening for malignant neoplasm of respiratory organs: Secondary | ICD-10-CM

## 2021-10-11 DIAGNOSIS — Z87891 Personal history of nicotine dependence: Secondary | ICD-10-CM

## 2021-10-11 DIAGNOSIS — F17210 Nicotine dependence, cigarettes, uncomplicated: Secondary | ICD-10-CM

## 2021-11-16 ENCOUNTER — Ambulatory Visit (INDEPENDENT_AMBULATORY_CARE_PROVIDER_SITE_OTHER): Payer: Medicare Other | Admitting: Acute Care

## 2021-11-16 ENCOUNTER — Encounter: Payer: Self-pay | Admitting: Acute Care

## 2021-11-16 DIAGNOSIS — F17210 Nicotine dependence, cigarettes, uncomplicated: Secondary | ICD-10-CM | POA: Diagnosis not present

## 2021-11-16 NOTE — Progress Notes (Signed)
Virtual Visit via Telephone Note  I connected with Robert Pugh on 10/26/21 at  3:30 PM EDT by telephone and verified that I am speaking with the correct person using two identifiers.  Location: Patient:  At home Provider: [address] [place], [place], [address]    I discussed the limitations, risks, security and privacy concerns of performing an evaluation and management service by telephone and the availability of in person appointments. I also discussed with the patient that there may be a patient responsible charge related to this service. The patient expressed understanding and agreed to proceed.   Shared Decision Making Visit Lung Cancer Screening Program [phone])   Eligibility: Age 71 y.o. Pack Years Smoking History Calculation 49 pack year smoking history (# packs/per year x # years smoked) Recent History of coughing up blood  no Unexplained weight loss? no ( >Than 15 pounds within the last 6 months ) Prior History Lung / other cancer no (Diagnosis within the last 5 years already requiring surveillance chest CT Scans). Smoking Status Current Smoker Former Smokers: Years since quit:  NA  Quit Date:  NA  Visit Components: Discussion included one or more decision making aids. yes Discussion included risk/benefits of screening. yes Discussion included potential follow up diagnostic testing for abnormal scans. yes Discussion included meaning and risk of over diagnosis. yes Discussion included meaning and risk of False Positives. yes Discussion included meaning of total radiation exposure. yes  Counseling Included: Importance of adherence to annual lung cancer LDCT screening. yes Impact of comorbidities on ability to participate in the program. yes Ability and willingness to under diagnostic treatment. yes  Smoking Cessation Counseling: Current Smokers:  Discussed importance of smoking cessation. yes Information about tobacco cessation classes and  interventions provided to patient. yes Patient provided with "ticket" for LDCT Scan. yes Symptomatic Patient. no  Counseling NA Diagnosis Code: Tobacco Use Z72.0 Asymptomatic Patient yes  Counseling (Intermediate counseling: > three minutes counseling) U0454 Former Smokers:  Discussed the importance of maintaining cigarette abstinence. yes Diagnosis Code: Personal History of Nicotine Dependence. U98.119 Information about tobacco cessation classes and interventions provided to patient. Yes Patient provided with "ticket" for LDCT Scan. yes Written Order for Lung Cancer Screening with LDCT placed in Epic. Yes (CT Chest Lung Cancer Screening Low Dose W/O CM) [id number] Z12.2-Screening of respiratory organs Z87.891-Personal history of nicotine dependence  I have spent 25 minutes of face to face/ virtual visit   time with  Robert Pugh discussing the risks and benefits of lung cancer screening. We viewed / discussed a power point together that explained in detail the above noted topics. We paused at intervals to allow for questions to be asked and answered to ensure understanding.We discussed that the single most powerful action that he can take to decrease his risk of developing lung cancer is to quit smoking. We discussed whether or not he is ready to commit to setting a quit date. We discussed options for tools to aid in quitting smoking including nicotine replacement therapy, non-nicotine medications, support groups, Quit Smart classes, and behavior modification. We discussed that often times setting smaller, more achievable goals, such as eliminating 1 cigarette a day for a week and then 2 cigarettes a day for a week can be helpful in slowly decreasing the number of cigarettes smoked. This allows for a sense of accomplishment as well as providing a clinical benefit. I provided  him  with smoking cessation  information  with contact information for community resources, classes,  free nicotine replacement  therapy, and access to mobile apps, text messaging, and on-line smoking cessation help. I have also provided  him  the office contact information in the event he needs to contact me, or the screening staff. We discussed the time and location of the scan, and that either Abigail Miyamoto RN, Karlton Lemon, RN  or I will call / send a letter with the results within 24-72 hours of receiving them. The patient verbalized understanding of all of  the above and had no further questions upon leaving the office. They have my contact information in the event they have any further questions.  I spent 3 minutes counseling on smoking cessation and the health risks of continued tobacco abuse.  I explained to the patient that there has been a high incidence of coronary artery disease noted on these exams. I explained that this is a non-gated exam therefore degree or severity cannot be determined. This patient is not on statin therapy. I have asked the patient to follow-up with their PCP regarding any incidental finding of coronary artery disease and management with diet or medication as their PCP  feels is clinically indicated. The patient verbalized understanding of the above and had no further questions upon completion of the visit.      Bevelyn Ngo, NP  11/16/2021

## 2021-11-19 ENCOUNTER — Ambulatory Visit (HOSPITAL_COMMUNITY)
Admission: RE | Admit: 2021-11-19 | Discharge: 2021-11-19 | Disposition: A | Discharge status: Home / Self Care | Payer: Medicare Other | Source: Ambulatory Visit | Attending: Family Medicine | Admitting: Family Medicine

## 2021-11-19 DIAGNOSIS — Z87891 Personal history of nicotine dependence: Secondary | ICD-10-CM | POA: Insufficient documentation

## 2021-11-19 DIAGNOSIS — F17210 Nicotine dependence, cigarettes, uncomplicated: Secondary | ICD-10-CM | POA: Diagnosis present

## 2021-11-19 DIAGNOSIS — Z122 Encounter for screening for malignant neoplasm of respiratory organs: Secondary | ICD-10-CM | POA: Insufficient documentation

## 2021-11-22 ENCOUNTER — Other Ambulatory Visit: Payer: Self-pay

## 2021-11-22 DIAGNOSIS — F17210 Nicotine dependence, cigarettes, uncomplicated: Secondary | ICD-10-CM

## 2021-11-22 DIAGNOSIS — Z87891 Personal history of nicotine dependence: Secondary | ICD-10-CM

## 2021-11-22 DIAGNOSIS — Z122 Encounter for screening for malignant neoplasm of respiratory organs: Secondary | ICD-10-CM

## 2021-12-23 ENCOUNTER — Ambulatory Visit: Payer: Medicare Other | Admitting: Internal Medicine

## 2021-12-23 ENCOUNTER — Encounter: Payer: Self-pay | Admitting: Internal Medicine

## 2021-12-23 DIAGNOSIS — J449 Chronic obstructive pulmonary disease, unspecified: Secondary | ICD-10-CM | POA: Diagnosis not present

## 2021-12-23 DIAGNOSIS — F17210 Nicotine dependence, cigarettes, uncomplicated: Secondary | ICD-10-CM

## 2021-12-23 DIAGNOSIS — R058 Other specified cough: Secondary | ICD-10-CM

## 2021-12-23 NOTE — Assessment & Plan Note (Signed)
Active smoker/ MZ  Spirometry 06/08/2017  FEV1 1.43 (36%)  Ratio 41 with classic curvature   - 06/08/2017   stiolto sample > did not tol due to muscle cramps in neck and shoulders - 06/28/2017  After extensive coaching inhaler device  effectiveness =    90% > try dulera 200 2bid and pred 20 mg daily until better then 10 mg daily > much better 07/07/2017  - PFT's  07/07/2017  FEV1 1.70 (44 % ) ratio 42  p 5 % improvement from saba p dulera 200 prior to study with DLCO  34/37 % corrects to 92  % for alv volume   - 07/07/2017 taper off pred x 2 weeks  - alpha one screening 07/07/2017  >  MZ level 98   - 08/24/2017   try add spiriva 2 respimat each am - 10/09/17 placed on daily prednisone   - 06/27/2018    Changed to bevespi req by insurance  - 01/10/2019  After extensive coaching inhaler device,  effectiveness =    90% with smi, try again to see if insurance will cover stiolto   -  01/10/2019   Walked RA x one lap =  approx 250 ft - stopped due to sob with sats 92% @ slow pace    -  07/06/2020  After extensive coaching inhaler device,  effectiveness =    80% breztri Take 2 puffs first thing in am and then another 2 puffs about 12 hours later.  - 02/09/2021 establish floor of 5 mg pred daily for copd/ 10 mg ceiling    Group D (now reclassified as E) in terms of symptom/risk and laba/lama/ICS  therefore appropriate rx at this point >>>  breztr 2bid and prn saba  Prednisone floor of 5 mg is due to rheumatologic symptoms, not resp with all refills at this point per rheum

## 2021-12-23 NOTE — Assessment & Plan Note (Addendum)
Onset early 2019  Sinus CT 10/17/2017 >>>  Acute L max sinusitis > augmentin x 20 days then ov 21 days with sinus CT > resolved but still coughing - gabapentin 100  Mg tid trial  Plus prn  1st gen H1 blockers> could not tol gabapentin / cough better s gerd rx as of 01/25/2018 > flared off pred 05/29/18 resumed prednisone 20 ceiling and 5 mg daily  - Allergy profile 07/30/2018 >  Eos 0.0 /  IgE  34 RAST neg  Adequate control on present rx, reviewed in detail with pt > no change in rx needed   = 1st gen H1 blockers per guidelines    F/u q 6 m, call sooner prn   Each maintenance medication was reviewed in detail including emphasizing most importantly the difference between maintenance and prns and under what circumstances the prns are to be triggered using an action plan format where appropriate.  Total time for H and P, chart review, counseling, reviewing hfa device(s) and generating customized AVS unique to this office visit / same day charting = 20 min

## 2021-12-23 NOTE — Assessment & Plan Note (Addendum)
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
## 2021-12-23 NOTE — Patient Instructions (Signed)
The key is to stop smoking completely before smoking completely stops you!   See your PCP for referral to cardiologist locally or we can refer you here    Please schedule a follow up visit in 6 months but call sooner if needed

## 2022-02-11 ENCOUNTER — Other Ambulatory Visit: Payer: Self-pay | Admitting: Internal Medicine

## 2022-02-15 ENCOUNTER — Telehealth: Payer: Self-pay | Admitting: Internal Medicine

## 2022-02-15 MED ORDER — BREZTRI AEROSPHERE 160-9-4.8 MCG/ACT IN AERO: 2.0000 | INHALATION_SPRAY | Freq: Two times a day (BID) | RESPIRATORY_TRACT | 11 refills | Status: DC

## 2022-02-15 NOTE — Telephone Encounter (Signed)
Spoke with patient and notified him that I sent refill in for him. Nothing further needed.

## 2022-02-15 NOTE — Telephone Encounter (Signed)
Pt is out of breztri inhaler as of Friday of last week. States the prescription refill date has ended. He is not due for an appt until January 2024. Pharmacy is [facility] in [place], [place]. They have contacted [place] with no response. LOV August 2023. Please advise.

## 2022-04-27 ENCOUNTER — Encounter (HOSPITAL_COMMUNITY): Payer: Self-pay | Admitting: Neurosurgery

## 2022-04-27 ENCOUNTER — Other Ambulatory Visit: Payer: Self-pay

## 2022-04-27 ENCOUNTER — Other Ambulatory Visit: Payer: Self-pay | Admitting: Neurosurgery

## 2022-04-27 NOTE — Progress Notes (Signed)
S.D.W- Instructions   Your procedure is scheduled on Thurs., Dec. 7, 2023 from 3:12PM-4:35PM.  Report to [facility] Main Entrance "A" at 12:45 P.M., then check in with the Admitting office.  Call this number if you have problems the morning of surgery:  [phone]             If you experience any cold or flu symptoms such as cough, fever, chills, shortness of breath, etc. between now and your scheduled surgery, please notify us at the above         number.  Remember:  Do not eat after midnight on Dec. 6th  You may drink clear liquids until 3 hours (12:15PM) prior to surgery time the morning of your surgery.   Clear liquids allowed are: Water, Non-Citrus Juices (without pulp), Carbonated Beverages, Clear Tea, Black Coffee ONLY (NO MILK, CREAM OR POWDERED CREAMER of any kind), and Gatorade    Take these medicines the morning of surgery with A SIP OF WATER: Budeson-Glycopyrrol-Formoterol (BREZTRI AEROSPHERE)  CloNIDine (CATAPRES)  PredniSONE (DELTASONE)   If Needed: Albuterol (PROAIR HFA) 108 (90 Base)  HYDROcodone-acetaminophen (NORCO)   As of today, STOP taking any Aspirin (unless otherwise instructed by your surgeon) Aleve, Naproxen, Ibuprofen, Motrin, Advil, Goody's, BC's, all herbal medications, fish oil, and all vitamins.          Do not wear jewelry. Do not wear lotions, powders, cologne or deodorant. Do not shave 48 hours prior to surgery.  Men may shave face and neck. Do not bring valuables to the hospital.  [facility] is not responsible for any belongings or valuables.    Do NOT Smoke (Tobacco/Vaping)  24 hours prior to your procedure  If you use a CPAP at night, you may bring your mask for your overnight stay.   Contacts, glasses, hearing aids, dentures or partials may not be worn into surgery, please bring cases for these belongings   For patients admitted to the hospital, discharge time will be determined by your treatment team.   Patients discharged the day of  surgery will not be allowed to drive home, and someone needs to stay with them for 24 hours.  Special instructions:    Oral Hygiene is also important to reduce your risk of infection.  Remember - BRUSH YOUR TEETH THE MORNING OF SURGERY WITH YOUR REGULAR TOOTHPASTE  [facility]- Preparing For Surgery  Before surgery, you can play an important role. Because skin is not sterile, your skin needs to be as free of germs as possible. You can reduce the number of germs on your skin by washing with Antibacterial Soap before surgery.     Please follow these instructions carefully.     Shower the NIGHT BEFORE SURGERY and the MORNING OF SURGERY with Antibacterial Soap.   Pat yourself dry with a CLEAN TOWEL.  Wear CLEAN PAJAMAS to bed the night before surgery  Place CLEAN SHEETS on your bed the night before your surgery  DO NOT SLEEP WITH PETS.  Day of Surgery:  Take a shower with Antibacterial soap. Wear Clean/Comfortable clothing the morning of surgery Do not apply any deodorants/lotions.   Remember to brush your teeth WITH YOUR REGULAR TOOTHPASTE.   If you test positive for Covid, or been in contact with anyone that has tested positive in the last 10 days, please notify your surgeon.  SURGICAL WAITING ROOM VISITATION Patients having surgery or a procedure may have no more than 2 support people in the waiting area - these  visitors may rotate.   Children under the age of 52 must have an adult with them who is not the patient. If the patient needs to stay at the hospital during part of their recovery, the visitor guidelines for inpatient rooms apply. Pre-op nurse will coordinate an appropriate time for 1 support person to accompany patient in pre-op.  This support person may not rotate.   Please refer to the [facility] website for the visitor guidelines for Inpatients (after your surgery is over and you are in a regular room).

## 2022-04-27 NOTE — Progress Notes (Signed)
PCP - Dr. Nolen Mu  Cardiologist - Denies  EP- Denies  Endocrine- Denies   Pulm- Denies  Chest x-ray - Denies  EKG - 04/28/22- Day of surgery  Stress Test - Denies  ECHO - Denies  Cardiac Cath - Denies  AICD-na PM-na LOOP-na  Nerve Stimulator- Denies  Dialysis- Denies  Sleep Study - Denies CPAP - Denies  LABS- 04/28/22: CBC, BMP, PCR  ASA- Denies  ERAS- Yes- clears until 1215  HA1C- Denies  Anesthesia- No  Pt denies having chest pain, sob, or fever during the the pre-op call. All instructions explained to the pt, with a verbal understanding of the material. Pt also instructed to wear a mask and social distance if he goes out. The opportunity to ask questions was provided.

## 2022-04-28 ENCOUNTER — Encounter (HOSPITAL_COMMUNITY): Payer: Self-pay | Admitting: Neurosurgery

## 2022-04-28 ENCOUNTER — Ambulatory Visit (HOSPITAL_COMMUNITY): Payer: Medicare Other | Admitting: Anesthesiology

## 2022-04-28 ENCOUNTER — Encounter (HOSPITAL_COMMUNITY)
Admission: RE | Disposition: A | Discharge status: Home / Self Care | Payer: Self-pay | Source: Home / Self Care | Attending: Neurosurgery

## 2022-04-28 ENCOUNTER — Ambulatory Visit (HOSPITAL_COMMUNITY): Payer: Medicare Other

## 2022-04-28 ENCOUNTER — Ambulatory Visit (HOSPITAL_COMMUNITY)
Admission: RE | Admit: 2022-04-28 | Discharge: 2022-04-28 | Disposition: A | Discharge status: Home / Self Care | Payer: Medicare Other | Attending: Neurosurgery | Admitting: Neurosurgery

## 2022-04-28 ENCOUNTER — Ambulatory Visit (HOSPITAL_BASED_OUTPATIENT_CLINIC_OR_DEPARTMENT_OTHER): Payer: Medicare Other | Admitting: Anesthesiology

## 2022-04-28 ENCOUNTER — Other Ambulatory Visit: Payer: Self-pay

## 2022-04-28 DIAGNOSIS — I10 Essential (primary) hypertension: Secondary | ICD-10-CM

## 2022-04-28 DIAGNOSIS — F17210 Nicotine dependence, cigarettes, uncomplicated: Secondary | ICD-10-CM | POA: Insufficient documentation

## 2022-04-28 DIAGNOSIS — J449 Chronic obstructive pulmonary disease, unspecified: Secondary | ICD-10-CM

## 2022-04-28 DIAGNOSIS — K219 Gastro-esophageal reflux disease without esophagitis: Secondary | ICD-10-CM | POA: Insufficient documentation

## 2022-04-28 DIAGNOSIS — M4854XA Collapsed vertebra, not elsewhere classified, thoracic region, initial encounter for fracture: Secondary | ICD-10-CM | POA: Insufficient documentation

## 2022-04-28 HISTORY — DX: Dyspnea, unspecified: R06.00

## 2022-04-28 HISTORY — PX: KYPHOPLASTY: SHX5884

## 2022-04-28 LAB — CBC
HCT: 48.1 % (ref 39.0–52.0)
Hemoglobin: 15.7 g/dL (ref 13.0–17.0)
MCH: 22.2 pg — ABNORMAL LOW (ref 26.0–34.0)
MCHC: 32.6 g/dL (ref 30.0–36.0)
MCV: 67.9 fL — ABNORMAL LOW (ref 80.0–100.0)
Platelets: 398 10*3/uL (ref 150–400)
RBC: 7.08 MIL/uL — ABNORMAL HIGH (ref 4.22–5.81)
RDW: 18.4 % — ABNORMAL HIGH (ref 11.5–15.5)
WBC: 18.2 10*3/uL — ABNORMAL HIGH (ref 4.0–10.5)
nRBC: 0 % (ref 0.0–0.2)

## 2022-04-28 LAB — SURGICAL PCR SCREEN
MRSA, PCR: NEGATIVE
Staphylococcus aureus: NEGATIVE

## 2022-04-28 LAB — BASIC METABOLIC PANEL
Anion gap: 7 (ref 5–15)
BUN: 9 mg/dL (ref 8–23)
CO2: 23 mmol/L (ref 22–32)
Calcium: 9 mg/dL (ref 8.9–10.3)
Chloride: 108 mmol/L (ref 98–111)
Creatinine, Ser: 0.72 mg/dL (ref 0.61–1.24)
GFR, Estimated: >60 mL/min (ref >60)
Glucose, Bld: 122 mg/dL — ABNORMAL HIGH (ref 70–99)
Potassium: 4.2 mmol/L (ref 3.5–5.1)
Sodium: 138 mmol/L (ref 135–145)

## 2022-04-28 SURGERY — KYPHOPLASTY
Anesthesia: General | Site: Spine Thoracic

## 2022-04-28 MED ORDER — LIDOCAINE-EPINEPHRINE 0.5 %-1:200000 IJ SOLN
INTRAMUSCULAR | Status: AC
  Filled 2022-04-28: qty 1

## 2022-04-28 MED ORDER — MIDAZOLAM HCL 2 MG/2ML IJ SOLN
INTRAMUSCULAR | Status: AC
  Filled 2022-04-28: qty 2

## 2022-04-28 MED ORDER — HYDROCODONE-ACETAMINOPHEN 7.5-325 MG PO TABS: 1.0000 | ORAL_TABLET | Freq: Four times a day (QID) | ORAL | 0 refills | Status: DC | PRN

## 2022-04-28 MED ORDER — ALBUTEROL SULFATE HFA 108 (90 BASE) MCG/ACT IN AERS
INHALATION_SPRAY | RESPIRATORY_TRACT | Status: DC | PRN
  Administered 2022-04-28: 8 via RESPIRATORY_TRACT

## 2022-04-28 MED ORDER — MIDAZOLAM HCL 2 MG/2ML IJ SOLN
INTRAMUSCULAR | Status: DC | PRN
  Administered 2022-04-28: 2 mg via INTRAVENOUS

## 2022-04-28 MED ORDER — HYDROCODONE-ACETAMINOPHEN 7.5-325 MG PO TABS: 1.0000 | ORAL_TABLET | Freq: Four times a day (QID) | ORAL | 0 refills | Status: AC | PRN

## 2022-04-28 MED ORDER — FENTANYL CITRATE (PF) 250 MCG/5ML IJ SOLN
INTRAMUSCULAR | Status: AC
  Filled 2022-04-28: qty 5

## 2022-04-28 MED ORDER — CHLORHEXIDINE GLUCONATE 0.12 % MT SOLN
15.0000 mL | Freq: Once | OROMUCOSAL | Status: AC
  Administered 2022-04-28: 15 mL via OROMUCOSAL
  Filled 2022-04-28: qty 15

## 2022-04-28 MED ORDER — ALBUTEROL SULFATE HFA 108 (90 BASE) MCG/ACT IN AERS
INHALATION_SPRAY | RESPIRATORY_TRACT | Status: AC
  Filled 2022-04-28: qty 6.7

## 2022-04-28 MED ORDER — ROCURONIUM BROMIDE 10 MG/ML (PF) SYRINGE
PREFILLED_SYRINGE | INTRAVENOUS | Status: DC | PRN
  Administered 2022-04-28: 60 mg via INTRAVENOUS

## 2022-04-28 MED ORDER — PHENYLEPHRINE 80 MCG/ML (10ML) SYRINGE FOR IV PUSH (FOR BLOOD PRESSURE SUPPORT)
PREFILLED_SYRINGE | INTRAVENOUS | Status: DC | PRN
  Administered 2022-04-28: 160 ug via INTRAVENOUS

## 2022-04-28 MED ORDER — 0.9 % SODIUM CHLORIDE (POUR BTL) OPTIME
TOPICAL | Status: DC | PRN
  Administered 2022-04-28: 1000 mL

## 2022-04-28 MED ORDER — IPRATROPIUM-ALBUTEROL 0.5-2.5 (3) MG/3ML IN SOLN
3.0000 mL | Freq: Once | RESPIRATORY_TRACT | Status: AC
  Administered 2022-04-28: 3 mL via RESPIRATORY_TRACT

## 2022-04-28 MED ORDER — IOPAMIDOL (ISOVUE-300) INJECTION 61%
INTRAVENOUS | Status: DC | PRN
  Administered 2022-04-28: 20 mL

## 2022-04-28 MED ORDER — CHLORHEXIDINE GLUCONATE CLOTH 2 % EX PADS: 6.0000 | MEDICATED_PAD | Freq: Once | CUTANEOUS | Status: DC

## 2022-04-28 MED ORDER — LACTATED RINGERS IV SOLN: INTRAVENOUS | Status: DC

## 2022-04-28 MED ORDER — ONDANSETRON HCL 4 MG/2ML IJ SOLN
INTRAMUSCULAR | Status: DC | PRN
  Administered 2022-04-28: 4 mg via INTRAVENOUS

## 2022-04-28 MED ORDER — CEFAZOLIN SODIUM-DEXTROSE 2-4 GM/100ML-% IV SOLN
2.0000 g | INTRAVENOUS | Status: AC
  Administered 2022-04-28: 2 g via INTRAVENOUS

## 2022-04-28 MED ORDER — DEXAMETHASONE SODIUM PHOSPHATE 10 MG/ML IJ SOLN
INTRAMUSCULAR | Status: DC | PRN
  Administered 2022-04-28: 10 mg via INTRAVENOUS

## 2022-04-28 MED ORDER — IPRATROPIUM-ALBUTEROL 0.5-2.5 (3) MG/3ML IN SOLN
RESPIRATORY_TRACT | Status: AC
  Filled 2022-04-28: qty 3

## 2022-04-28 MED ORDER — ACETAMINOPHEN 10 MG/ML IV SOLN
INTRAVENOUS | Status: AC
  Filled 2022-04-28: qty 100

## 2022-04-28 MED ORDER — LIDOCAINE 2% (20 MG/ML) 5 ML SYRINGE
INTRAMUSCULAR | Status: DC | PRN
  Administered 2022-04-28: 100 mg via INTRAVENOUS

## 2022-04-28 MED ORDER — FENTANYL CITRATE (PF) 250 MCG/5ML IJ SOLN
INTRAMUSCULAR | Status: DC | PRN
  Administered 2022-04-28: 100 ug via INTRAVENOUS

## 2022-04-28 MED ORDER — LIDOCAINE-EPINEPHRINE 0.5 %-1:200000 IJ SOLN
INTRAMUSCULAR | Status: DC | PRN
  Administered 2022-04-28: 5 mL

## 2022-04-28 MED ORDER — ACETAMINOPHEN 500 MG PO TABS
1000.0000 mg | ORAL_TABLET | Freq: Once | ORAL | Status: AC
  Administered 2022-04-28: 1000 mg via ORAL

## 2022-04-28 MED ORDER — PROPOFOL 10 MG/ML IV BOLUS
INTRAVENOUS | Status: DC | PRN
  Administered 2022-04-28: 130 mg via INTRAVENOUS

## 2022-04-28 MED ORDER — ORAL CARE MOUTH RINSE: 15.0000 mL | Freq: Once | OROMUCOSAL | Status: AC

## 2022-04-28 MED ORDER — SUGAMMADEX SODIUM 200 MG/2ML IV SOLN
INTRAVENOUS | Status: DC | PRN
  Administered 2022-04-28: 200 mg via INTRAVENOUS

## 2022-04-28 MED ORDER — FENTANYL CITRATE (PF) 100 MCG/2ML IJ SOLN: 25.0000 ug | INTRAMUSCULAR | Status: DC | PRN

## 2022-04-28 MED ORDER — PROPOFOL 10 MG/ML IV BOLUS
INTRAVENOUS | Status: AC
  Filled 2022-04-28: qty 20

## 2022-04-28 SURGICAL SUPPLY — 34 items
BAG COUNTER SPONGE SURGICOUNT (BAG) ×1 IMPLANT
BLADE CLIPPER SURG (BLADE) IMPLANT
BLADE SURG 15 STRL LF DISP TIS (BLADE) ×1 IMPLANT
BLADE SURG 15 STRL SS (BLADE) ×2
CEMENT KYPHON CX01A KIT/MIXER (Cement) IMPLANT
DERMABOND ADVANCED .7 DNX12 (GAUZE/BANDAGES/DRESSINGS) ×1 IMPLANT
DRAPE C-ARM 42X72 X-RAY (DRAPES) ×1 IMPLANT
DRAPE HALF SHEET 40X57 (DRAPES) ×1 IMPLANT
DRAPE LAPAROTOMY 100X72X124 (DRAPES) ×1 IMPLANT
DRAPE SURG 17X23 STRL (DRAPES) ×1 IMPLANT
DRAPE WARM FLUID 44X44 (DRAPES) ×1 IMPLANT
DURAPREP 26ML APPLICATOR (WOUND CARE) ×1 IMPLANT
GAUZE 4X4 16PLY ~~LOC~~+RFID DBL (SPONGE) ×1 IMPLANT
GLOVE ECLIPSE 6.5 STRL STRAW (GLOVE) ×1 IMPLANT
GLOVE EXAM NITRILE XL STR (GLOVE) IMPLANT
GOWN STRL REUS W/ TWL LRG LVL3 (GOWN DISPOSABLE) ×2 IMPLANT
GOWN STRL REUS W/ TWL XL LVL3 (GOWN DISPOSABLE) IMPLANT
GOWN STRL REUS W/TWL 2XL LVL3 (GOWN DISPOSABLE) IMPLANT
GOWN STRL REUS W/TWL LRG LVL3 (GOWN DISPOSABLE) ×2
GOWN STRL REUS W/TWL XL LVL3 (GOWN DISPOSABLE)
KIT BASIN OR (CUSTOM PROCEDURE TRAY) ×1 IMPLANT
KIT TURNOVER KIT B (KITS) ×1 IMPLANT
NDL HYPO 25X1 1.5 SAFETY (NEEDLE) ×1 IMPLANT
NEEDLE HYPO 25X1 1.5 SAFETY (NEEDLE) ×2 IMPLANT
NS IRRIG 1000ML POUR BTL (IV SOLUTION) ×1 IMPLANT
PACK BASIC III (CUSTOM PROCEDURE TRAY) ×1
PACK SRG BSC III STRL LF ECLPS (CUSTOM PROCEDURE TRAY) ×1 IMPLANT
PAD ARMBOARD 7.5X6 YLW CONV (MISCELLANEOUS) ×3 IMPLANT
SPECIMEN JAR SMALL (MISCELLANEOUS) IMPLANT
STAPLER SKIN PROX WIDE 3.9 (STAPLE) ×1 IMPLANT
SUT VIC AB 3-0 SH 8-18 (SUTURE) ×1 IMPLANT
SYR CONTROL 10ML LL (SYRINGE) ×2 IMPLANT
TOWEL GREEN STERILE (TOWEL DISPOSABLE) ×1 IMPLANT
TOWEL GREEN STERILE FF (TOWEL DISPOSABLE) ×1 IMPLANT

## 2022-04-28 NOTE — Anesthesia Procedure Notes (Signed)
Procedure Name: Intubation Date/Time: 04/28/2022 5:00 PM  Performed by: Elvin So, CRNAPre-anesthesia Checklist: Patient identified, Emergency Drugs available, Suction available and Patient being monitored Patient Re-evaluated:Patient Re-evaluated prior to induction Oxygen Delivery Method: Circle System Utilized Preoxygenation: Pre-oxygenation with 100% oxygen Induction Type: IV induction Ventilation: Mask ventilation without difficulty Laryngoscope Size: Mac and 4 Grade View: Grade I Tube type: Oral Number of attempts: 1 Airway Equipment and Method: Stylet and Oral airway Placement Confirmation: ETT inserted through vocal cords under direct vision, positive ETCO2 and breath sounds checked- equal and bilateral Secured at: 23 cm Tube secured with: Tape Dental Injury: Teeth and Oropharynx as per pre-operative assessment

## 2022-04-28 NOTE — Discharge Instructions (Signed)
Care After ?These instructions give you information on caring for yourself after your procedure. Your doctor may also give you more specific instructions. Call your doctor if you have any problems or questions after your procedure. ?HOME CARE ?Take medicine as told by your doctor. ?Keep your wound covered for 24 hours or as told by your doctor. ?Ask your doctor when you can bathe or shower. ?Put ice on your wound if it helps your pain. ?Put ice in a plastic bag. ?Place a towel between your skin and the bag. ?Leave the ice on for 15 to 20 minutes, 3 to 4 times a day. ? ?Return to normal activities as told by your doctor. ?Ask your doctor what stretches and exercises you can do. ?Do not bend or lift anything heavy as told by your doctor. ?GET HELP RIGHT AWAY IF:  ?You have bad back pain that comes on suddenly. ?You cannot control when you pee (urinate) or poop (bowel movement). ?You lose feeling (numbness) in your legs or feet, or they become weak. ?You have shooting pain down your legs. ?You have a fever. ?Your wound becomes red, puffy (swollen), or tender to the touch. ?You are bleeding or leaking fluid from the wound. ?You are sick to your stomach (nauseous) or throw up (vomit) for more than 24 hours after the procedure. ?Your back pain does not get better. ?MAKE SURE YOU: ?Understand these instructions. ?Will watch your condition. ?Will get help right away if you are not doing well or get worse. ?Document Released: 08/03/2009 Document Revised: 08/01/2011 Document Reviewed: 10/13/2010 ?ExitCare? Patient Information ?2013 [employer].  ?

## 2022-04-28 NOTE — Op Note (Signed)
04/28/2022  7:05 PM  PATIENT:  Robert Pugh  71 y.o. male  PRE-OPERATIVE DIAGNOSIS:  Compression fracture T7  POST-OPERATIVE DIAGNOSIS:  Compression fracture,T7  PROCEDURE:  Procedure(s): T7 KYPHOPLASTY  SURGEON:  Surgeon(s): Coletta Memos, MD  ANESTHESIA:   general  EBL:  No intake/output data recorded.  BLOOD ADMINISTERED:none  COUNT:per nursing  SPECIMEN:  No Specimen  DICTATION: [name] was taken to the operating room, intubated and placed under a general anesthetic without difficulty. He was positioned prone on the operating room table with all pressure points properly padded. Her back was prepped and draped in a sterile manner. With fluoroscopy I localized the T7 pedicles bilaterally. I injected lidocaine into the entry sites on both the left and right sides. I started by making a stab incision on the right side and entering the right T7 pedicle with fluoroscopic guidance. Once good position was obtained, I drilled into the vertebral body. I then placed the kyphoplasty balloon into the T7 vertebra and inflated the balloon. I then inserted 5.0cc of methylmethacrylate into the vertebral body under fluoroscopic guidance. I achieved a very good fill of the cavity, staying within the confines of the vertebral body. I removed the instrumentation from the vertebral body, and the final films looked good. I closed the stab incision with vicryl suture and used Dermabond for a sterile dressing. Marland Kitchen    PLAN OF CARE: Admit to inpatient   PATIENT DISPOSITION:  PACU - hemodynamically stable.   Delay start of Pharmacological VTE agent (>24hrs) due to surgical blood loss or risk of bleeding:  yes

## 2022-04-28 NOTE — Transfer of Care (Signed)
Immediate Anesthesia Transfer of Care Note  Patient: Robert Pugh  Procedure(s) Performed: T7 KYPHOPLASTY (Spine Thoracic)  Patient Location: PACU  Anesthesia Type:General  Level of Consciousness: awake and patient cooperative  Airway & Oxygen Therapy: Patient Spontanous Breathing and Patient connected to face mask oxygen  Post-op Assessment: Report given to RN, Post -op Vital signs reviewed and stable, and Patient moving all extremities  Post vital signs: Reviewed and stable  Last Vitals:  Vitals Value Taken Time  BP 150/90 04/28/22 1815  Temp    Pulse 101 04/28/22 1816  Resp 20 04/28/22 1816  SpO2 96 % 04/28/22 1816  Vitals shown include unvalidated device data.  Last Pain:  Vitals:   04/28/22 1328  TempSrc:   PainSc: 6       Patients Stated Pain Goal: 2 (04/28/22 1328)  Complications: No notable events documented.

## 2022-04-28 NOTE — Anesthesia Preprocedure Evaluation (Addendum)
Anesthesia Evaluation  Patient identified by MRN, date of birth, ID band Patient awake    Reviewed: Allergy & Precautions, NPO status , Patient's Chart, lab work & pertinent test results  Airway Mallampati: II  TM Distance: >3 FB Neck ROM: Full    Dental no notable dental hx.    Pulmonary COPD,  COPD inhaler, Current Smoker and Patient abstained from smoking.   Pulmonary exam normal        Cardiovascular hypertension, Pt. on medications  Rhythm:Regular Rate:Normal     Neuro/Psych  Headaches  negative psych ROS   GI/Hepatic Neg liver ROS,GERD  ,,  Endo/Other  negative endocrine ROS    Renal/GU Renal disease  negative genitourinary   Musculoskeletal  (+) Arthritis , Osteoarthritis,    Abdominal Normal abdominal exam  (+)   Peds  Hematology negative hematology ROS (+)   Anesthesia Other Findings   Reproductive/Obstetrics                             Anesthesia Physical Anesthesia Plan  ASA: 3  Anesthesia Plan: General   Post-op Pain Management: Celebrex PO (pre-op)* and Tylenol PO (pre-op)*   Induction: Intravenous  PONV Risk Score and Plan: 1 and Ondansetron, Dexamethasone and Treatment may vary due to age or medical condition  Airway Management Planned: Mask and Oral ETT  Additional Equipment: None  Intra-op Plan:   Post-operative Plan: Extubation in OR  Informed Consent: I have reviewed the patients History and Physical, chart, labs and discussed the procedure including the risks, benefits and alternatives for the proposed anesthesia with the patient or authorized representative who has indicated his/her understanding and acceptance.     Dental advisory given  Plan Discussed with: CRNA  Anesthesia Plan Comments: (Lab Results      Component                Value               Date                      WBC                      18.2 (H)            04/28/2022                 HGB                      15.7                04/28/2022                HCT                      48.1                04/28/2022                MCV                      67.9 (L)            04/28/2022                PLT  398                 04/28/2022            Lab Results      Component                Value               Date                      NA                       138                 04/28/2022                K                        4.2                 04/28/2022                CO2                      23                  04/28/2022                GLUCOSE                  122 (H)             04/28/2022                BUN                      9                   04/28/2022                CREATININE               0.72                04/28/2022                CALCIUM                  9.0                 04/28/2022                GFRNONAA                 >60                 04/28/2022           )       Anesthesia Quick Evaluation

## 2022-04-28 NOTE — Anesthesia Postprocedure Evaluation (Signed)
Anesthesia Post Note  Patient: Robert Pugh  Procedure(s) Performed: T7 KYPHOPLASTY (Spine Thoracic)     Patient location during evaluation: PACU Anesthesia Type: General Level of consciousness: awake Pain management: pain level controlled Vital Signs Assessment: post-procedure vital signs reviewed and stable Respiratory status: spontaneous breathing, nonlabored ventilation and respiratory function stable Cardiovascular status: blood pressure returned to baseline and stable Postop Assessment: no apparent nausea or vomiting Anesthetic complications: no   No notable events documented.  Last Vitals:  Vitals:   04/28/22 2015 04/28/22 2030  BP: (!) 160/98   Pulse: 95 (!) 103  Resp: 17 17  Temp: 36.7 C   SpO2: 99% 96%    Last Pain:  Vitals:   04/28/22 2015  TempSrc:   PainSc: 2                  Jessika Rothery P Mattison Golay

## 2022-04-28 NOTE — H&P (Signed)
BP (!) 154/98   Pulse 100   Temp 98.3 F (36.8 C) (Oral)   Resp 18   Ht 6' (1.829 m)   Wt 78 kg   SpO2 96%   BMI 23.33 kg/m     Robert Pugh was sent to me for evaluation of a thoracic compression fracture.  He says he was chopping wood this past weekend and had severe and acute onset of pain in his back.  He had no bowel or bladder dysfunction.  He had no falls.  Strength appears to be normal.  He was then found on x-ray to have what was felt to be a new compression fracture.  His primary care physician, I was told by both Robert Pugh and his wife, would not prescribe pain medication to him.  They were told that they didn't do that and that he would need to see his neurosurgeon.  I had seen Robert Pugh last in 2021 for an altogether different reason. Allergies  Allergen Reactions   Aspirin     stomach upset   Gabapentin     Arms and legs jerking, vision changes   Family History  Problem Relation Age of Onset   Diabetes Mother    Multiple sclerosis Mother    Heart disease Father    Congestive Heart Failure Father    Esophageal cancer Neg Hx    Colon cancer Neg Hx    Pancreatic cancer Neg Hx    Prostate cancer Neg Hx    Past Medical History:  Diagnosis Date   Arthritis    Bowel obstruction (HCC)    Chronic headaches    COPD (chronic obstructive pulmonary disease) (HCC)    Dyspnea    GERD (gastroesophageal reflux disease)    High blood pressure    Kidney stone    Pneumonia    Past Surgical History:  Procedure Laterality Date   APPENDECTOMY  09/2016   BACK SURGERY     HERNIA REPAIR Right 2006   KNEE SURGERY Left    KYPHOPLASTY  09/2019   Left shoulder repair      LUMBAR LAMINECTOMY/DECOMPRESSION MICRODISCECTOMY Right 12/06/2019   Procedure: Right Lumbar Three-Four Far Lateral Discectomy;  Surgeon: Coletta Memos, MD;  Location: [facility] OR;  Service: Neurosurgery;  Laterality: Right;   plural infusion  1991   ROTATOR CUFF REPAIR Right    Social History   Socioeconomic History    Marital status: Married    Spouse name: Not on file   Number of children: 2   Years of education: Not on file   Highest education level: Not on file  Occupational History   Occupation: retired  Tobacco Use   Smoking status: Every Day    Packs/day: 1.00    Years: 40.00    Total pack years: 40.00    Types: Cigarettes   Smokeless tobacco: Never   Tobacco comments:    Less than 1/2 ppd 06-25-2021  Vaping Use   Vaping Use: Never used  Substance and Sexual Activity   Alcohol use: Not Currently   Drug use: Never   Sexual activity: Not on file  Other Topics Concern   Not on file  Social History Narrative   Not on file   Social Determinants of Health   Financial Resource Strain: Not on file  Food Insecurity: Not on file  Transportation Needs: Not on file  Physical Activity: Not on file  Stress: Not on file  Social Connections: Not on file  Intimate Partner Violence: Not  on file       Robert Pugh, on exam today, is in significant distress.  He is complaining about pain in his midback.  Strength is 5/5 in the upper and lower extremities. 2+ reflexes biceps, triceps, brachioradialis, knees, and ankles.     I will prescribe Robert Pugh some pain medication.  I am not exactly sure why his primary care physician would not.   I will order an MRI, and at that time we will be able to make a determination, he and I, as to what course of action he would like to proceed.  He is leaning toward a kyphoplasty, if that is at all available to him. Robert Pugh returns today.  He states that he is still having the pain, it is still severe, 7/10.  The MRI shows that he has a subacute fracture at T7.  The T6 fracture is chronic.    ASSESSMENT AND PLAN :  Robert Pugh would like to proceed with a kyphoplasty at T7.  We will try to get this set up as quickly as possible.  Risks and benefits, damage to the spinal cord, paralysis, damage to the nerve roots, bowel and/or bladder dysfunction, weakness in the legs,  inability to walk, and other risks, infection, bleeding were discussed.  He understands and wishes to proceed.

## 2022-04-29 ENCOUNTER — Encounter (HOSPITAL_COMMUNITY): Payer: Self-pay | Admitting: Neurosurgery

## 2022-06-14 ENCOUNTER — Encounter (HOSPITAL_COMMUNITY): Payer: Self-pay | Admitting: Neurosurgery

## 2022-06-14 ENCOUNTER — Other Ambulatory Visit: Payer: Self-pay | Admitting: Neurosurgery

## 2022-06-14 NOTE — Pre-Procedure Instructions (Signed)
PCP - Etter Sjogren Cardiologist - n/a Rheumatology - Dr. Scarlette Shorts  EKG - 04/28/22 Chest x-ray - CT 11/19/21 ECHO - n/a Cardiac Cath - n/a CPAP - n/a  Blood Thinner Instructions: n/a Aspirin Instructions: n/a  ERAS Protcol - Yes 1100 COVID TEST- no  Anesthesia review: yes abnormal CBC labs   -------------  SDW INSTRUCTIONS:  Your procedure is scheduled on 06/16/2022. Please report to [facility] Main Entrance "A" at 1130 A.M., and check in at the Admitting office. Call this number if you have problems the morning of surgery: [phone]   Remember: Do not eat after midnight the night before your surgery  You may drink clear liquids until 1100 the morning of your surgery.   Clear liquids allowed are: Water, Non-Citrus Juices (without pulp), Carbonated Beverages, Clear Tea, Black Coffee Only, and Gatorade   Medications to take morning of surgery with a sip of water include: clonidine, prednisone, inhalers   Please bring all inhalers with you the day of surgery.   As of today, STOP taking any Aspirin (unless otherwise instructed by your surgeon), Aleve, Naproxen, Ibuprofen, Motrin, Advil, Goody's, BC's, all herbal medications, fish oil, and all vitamins.    The Morning of Surgery Do not wear jewelry, make-up or nail polish. Do not wear lotions, powders, or perfumes/colognes, or deodorant Do not bring valuables to the hospital. [facility] is not responsible for any belongings or valuables.  If you are a smoker, DO NOT Smoke 24 hours prior to surgery  If you wear a CPAP at night please bring your mask the morning of surgery   Remember that you must have someone to transport you home after your surgery, and remain with you for 24 hours if you are discharged the same day.  Please bring cases for contacts, glasses, hearing aids, dentures or bridgework because it cannot be worn into surgery.   Patients discharged the day of surgery will not be allowed to drive home.   Please  shower the NIGHT BEFORE/MORNING OF SURGERY (use antibacterial soap like DIAL soap if possible). Wear comfortable clothes the morning of surgery. Oral Hygiene is also important to reduce your risk of infection.  Remember - BRUSH YOUR TEETH THE MORNING OF SURGERY WITH YOUR REGULAR TOOTHPASTE  Patient denies shortness of breath, fever, cough and chest pain.

## 2022-06-15 NOTE — Anesthesia Preprocedure Evaluation (Addendum)
Anesthesia Evaluation  Patient identified by MRN, date of birth, ID band Patient awake    Reviewed: Allergy & Precautions, NPO status , Patient's Chart, lab work & pertinent test results  Airway Mallampati: II  TM Distance: >3 FB Neck ROM: Full    Dental no notable dental hx.    Pulmonary Current Smoker and Patient abstained from smoking.   Pulmonary exam normal        Cardiovascular hypertension,  Rhythm:Regular Rate:Normal     Neuro/Psych    GI/Hepatic   Endo/Other    Renal/GU      Musculoskeletal   Abdominal Normal abdominal exam  (+)   Peds  Hematology   Anesthesia Other Findings   Reproductive/Obstetrics                             Anesthesia Physical Anesthesia Plan  ASA: 2  Anesthesia Plan: General   Post-op Pain Management:    Induction: Intravenous  PONV Risk Score and Plan: 1 and Ondansetron, Dexamethasone and Treatment may vary due to age or medical condition  Airway Management Planned: Mask and Oral ETT  Additional Equipment: None  Intra-op Plan:   Post-operative Plan: Extubation in OR  Informed Consent: I have reviewed the patients History and Physical, chart, labs and discussed the procedure including the risks, benefits and alternatives for the proposed anesthesia with the patient or authorized representative who has indicated his/her understanding and acceptance.     Dental advisory given  Plan Discussed with: CRNA  Anesthesia Plan Comments: (PAT note written 06/15/2022 by Myra Gianotti, PA-C.  )       Anesthesia Quick Evaluation

## 2022-06-15 NOTE — Progress Notes (Signed)
Anesthesia Chart Review: Robert Pugh  Case: [id number] Date/Time: 06/16/22 1345   Procedure: T8 KYPHOPLASTY - RM 21   Anesthesia type: General   Pre-op diagnosis: Wedge compression fracture of thoracic vertebra   Location: [facility] OR ROOM 21 / [place] OR   Surgeons: Ashok Pall, MD       DISCUSSION: Patient is a 72 year old male scheduled for the above procedure. S/p T7 kyphoplasty on 04/28/22.   History includes smoking, COPD (on Breztri), HTN, GERD, dyspnea, RA, spinal surgery (right L3-4 diskectomy 12/06/19).  He is a same-day workup, anesthesia team to evaluate on the day of surgery.  Of note his WBC was elevated at 21.1 on 12/06/2019 and 18.2 on 04/28/2022 (prior to previous kyphoplasty).  Unclear if this is chronic as currently no PCP notes readily available. He is on daily prednisone 5 mg. He is for updated labs as indicated.     VS: Ht 6\' 1"  (1.854 m)   Wt 74.4 kg   BMI 21.64 kg/m  BP Readings from Last 3 Encounters:  04/28/22 (!) 160/98  12/23/21 132/88  06/25/21 (!) 142/74   Pulse Readings from Last 3 Encounters:  04/28/22 (!) 103  12/23/21 68  06/25/21 80     PROVIDERS: Etter Sjogren, FNP is PCP  Ronnette Hila, MD is rheumatologist Angelina Sheriff) Christinia Gully, MD is pulmonologist Linna Hoff)   LABS: He is for updated labs on arrival as indicated.  Last lab results in [facility] include: Lab Results  Component Value Date   WBC 18.2 (H) 04/28/2022   HGB 15.7 04/28/2022   HCT 48.1 04/28/2022   PLT 398 04/28/2022   GLUCOSE 122 (H) 04/28/2022   NA 138 04/28/2022   K 4.2 04/28/2022   CL 108 04/28/2022   CREATININE 0.72 04/28/2022   BUN 9 04/28/2022   CO2 23 04/28/2022     IMAGES: CT Chest Lung Cancer Screen 11/19/21: IMPRESSION: 1. Lung-RADS 2S, benign appearance or behavior. Continue annual screening with low-dose chest CT without contrast in 12 months. 2. The "S" modifier above refers to potentially clinically significant non lung cancer related findings.  Specifically, there is aortic atherosclerosis, in addition to left main and three-vessel coronary artery disease. Please note that although the presence of coronary artery calcium documents the presence of coronary artery disease, the severity of this disease and any potential stenosis cannot be assessed on this non-gated CT examination. Assessment for potential risk factor modification, dietary therapy or pharmacologic therapy may be warranted, if clinically indicated. 3. Mild diffuse bronchial wall thickening with moderate to severe centrilobular and paraseptal emphysema; imaging findings suggestive of underlying COPD. - Aortic Atherosclerosis (ICD10-I70.0) and Emphysema (ICD10-J43.9).   EKG: 04/28/22: Sinus tachycardia Right axis deviation Pulmonary disease pattern Abnormal ECG No significant change since last tracing Confirmed by Skeet Latch [phone]) on 05/01/2022 11:41:35 PM   CV: N/A  Past Medical History:  Diagnosis Date   Arthritis    Bowel obstruction (HCC)    Chronic headaches    COPD (chronic obstructive pulmonary disease) (HCC)    Dyspnea    GERD (gastroesophageal reflux disease)    High blood pressure    Kidney stone    Pneumonia     Past Surgical History:  Procedure Laterality Date   APPENDECTOMY  09/2016   BACK SURGERY     HERNIA REPAIR Right 2006   KNEE SURGERY Left    KYPHOPLASTY  09/2019   KYPHOPLASTY N/A 04/28/2022   Procedure: T7 KYPHOPLASTY;  Surgeon: Ashok Pall, MD;  Location: [place];  Service: Neurosurgery;  Laterality: N/A;  RM 18 to follow 3C   Left shoulder repair      LUMBAR LAMINECTOMY/DECOMPRESSION MICRODISCECTOMY Right 12/06/2019   Procedure: Right Lumbar Three-Four Far Lateral Discectomy;  Surgeon: Ashok Pall, MD;  Location: [place];  Service: Neurosurgery;  Laterality: Right;   plural infusion  1991   ROTATOR CUFF REPAIR Right     MEDICATIONS: No current facility-administered medications for this encounter.    albuterol  (PROAIR HFA) 108 (90 Base) MCG/ACT inhaler   Budeson-Glycopyrrol-Formoterol (BREZTRI AEROSPHERE) 160-9-4.8 MCG/ACT AERO   chlorpheniramine (CHLOR-TRIMETON) 4 MG tablet   cloNIDine (CATAPRES) 0.1 MG tablet   HYDROcodone-acetaminophen (NORCO) 7.5-325 MG tablet   verapamil (CALAN-SR) 120 MG CR tablet   predniSONE (DELTASONE) 5 MG tablet    Myra Gianotti, PA-C Surgical Short Stay/Anesthesiology [facility] Phone [phone] [facility] Phone [phone] 06/15/2022 11:47 AM

## 2022-06-16 ENCOUNTER — Other Ambulatory Visit: Payer: Self-pay

## 2022-06-16 ENCOUNTER — Encounter (HOSPITAL_COMMUNITY)
Admission: RE | Disposition: A | Discharge status: Home / Self Care | Payer: Self-pay | Source: Home / Self Care | Attending: Neurosurgery

## 2022-06-16 ENCOUNTER — Ambulatory Visit (HOSPITAL_BASED_OUTPATIENT_CLINIC_OR_DEPARTMENT_OTHER): Payer: Medicare Other | Admitting: Vascular Surgery

## 2022-06-16 ENCOUNTER — Ambulatory Visit (HOSPITAL_COMMUNITY)
Admission: RE | Admit: 2022-06-16 | Discharge: 2022-06-16 | Disposition: A | Discharge status: Home / Self Care | Payer: Medicare Other | Attending: Neurosurgery | Admitting: Neurosurgery

## 2022-06-16 ENCOUNTER — Ambulatory Visit (HOSPITAL_COMMUNITY): Payer: Medicare Other

## 2022-06-16 ENCOUNTER — Ambulatory Visit (HOSPITAL_COMMUNITY): Payer: Medicare Other | Admitting: Vascular Surgery

## 2022-06-16 ENCOUNTER — Encounter (HOSPITAL_COMMUNITY): Payer: Self-pay | Admitting: Neurosurgery

## 2022-06-16 DIAGNOSIS — I2510 Atherosclerotic heart disease of native coronary artery without angina pectoris: Secondary | ICD-10-CM | POA: Insufficient documentation

## 2022-06-16 DIAGNOSIS — Z79899 Other long term (current) drug therapy: Secondary | ICD-10-CM | POA: Insufficient documentation

## 2022-06-16 DIAGNOSIS — F17200 Nicotine dependence, unspecified, uncomplicated: Secondary | ICD-10-CM | POA: Diagnosis not present

## 2022-06-16 DIAGNOSIS — I700 Atherosclerosis of aorta: Secondary | ICD-10-CM | POA: Insufficient documentation

## 2022-06-16 DIAGNOSIS — M8008XA Age-related osteoporosis with current pathological fracture, vertebra(e), initial encounter for fracture: Secondary | ICD-10-CM | POA: Diagnosis not present

## 2022-06-16 DIAGNOSIS — M069 Rheumatoid arthritis, unspecified: Secondary | ICD-10-CM | POA: Diagnosis not present

## 2022-06-16 DIAGNOSIS — I10 Essential (primary) hypertension: Secondary | ICD-10-CM | POA: Diagnosis not present

## 2022-06-16 DIAGNOSIS — J432 Centrilobular emphysema: Secondary | ICD-10-CM | POA: Insufficient documentation

## 2022-06-16 DIAGNOSIS — S22000D Wedge compression fracture of unspecified thoracic vertebra, subsequent encounter for fracture with routine healing: Secondary | ICD-10-CM

## 2022-06-16 DIAGNOSIS — K219 Gastro-esophageal reflux disease without esophagitis: Secondary | ICD-10-CM | POA: Insufficient documentation

## 2022-06-16 DIAGNOSIS — X58XXXA Exposure to other specified factors, initial encounter: Secondary | ICD-10-CM | POA: Diagnosis not present

## 2022-06-16 DIAGNOSIS — M4854XA Collapsed vertebra, not elsewhere classified, thoracic region, initial encounter for fracture: Secondary | ICD-10-CM | POA: Diagnosis present

## 2022-06-16 DIAGNOSIS — Z7951 Long term (current) use of inhaled steroids: Secondary | ICD-10-CM | POA: Diagnosis not present

## 2022-06-16 HISTORY — PX: KYPHOPLASTY: SHX5884

## 2022-06-16 LAB — CBC
HCT: 48.3 % (ref 39.0–52.0)
Hemoglobin: 15.5 g/dL (ref 13.0–17.0)
MCH: 21.8 pg — ABNORMAL LOW (ref 26.0–34.0)
MCHC: 32.1 g/dL (ref 30.0–36.0)
MCV: 67.8 fL — ABNORMAL LOW (ref 80.0–100.0)
Platelets: 365 10*3/uL (ref 150–400)
RBC: 7.12 MIL/uL — ABNORMAL HIGH (ref 4.22–5.81)
RDW: 17.9 % — ABNORMAL HIGH (ref 11.5–15.5)
WBC: 14.8 10*3/uL — ABNORMAL HIGH (ref 4.0–10.5)
nRBC: 0.1 % (ref 0.0–0.2)

## 2022-06-16 LAB — BASIC METABOLIC PANEL
Anion gap: 6 (ref 5–15)
BUN: 11 mg/dL (ref 8–23)
CO2: 22 mmol/L (ref 22–32)
Calcium: 8.7 mg/dL — ABNORMAL LOW (ref 8.9–10.3)
Chloride: 108 mmol/L (ref 98–111)
Creatinine, Ser: 0.64 mg/dL (ref 0.61–1.24)
GFR, Estimated: >60 mL/min (ref >60)
Glucose, Bld: 112 mg/dL — ABNORMAL HIGH (ref 70–99)
Potassium: 4.3 mmol/L (ref 3.5–5.1)
Sodium: 136 mmol/L (ref 135–145)

## 2022-06-16 LAB — SURGICAL PCR SCREEN
MRSA, PCR: NEGATIVE
Staphylococcus aureus: NEGATIVE

## 2022-06-16 SURGERY — KYPHOPLASTY
Anesthesia: General

## 2022-06-16 MED ORDER — ROCURONIUM BROMIDE 10 MG/ML (PF) SYRINGE
PREFILLED_SYRINGE | INTRAVENOUS | Status: AC
  Filled 2022-06-16: qty 10

## 2022-06-16 MED ORDER — LIDOCAINE-EPINEPHRINE 0.5 %-1:200000 IJ SOLN
INTRAMUSCULAR | Status: AC
  Filled 2022-06-16: qty 1

## 2022-06-16 MED ORDER — SUGAMMADEX SODIUM 200 MG/2ML IV SOLN
INTRAVENOUS | Status: DC | PRN
  Administered 2022-06-16: 200 mg via INTRAVENOUS

## 2022-06-16 MED ORDER — CHLORHEXIDINE GLUCONATE 0.12 % MT SOLN
OROMUCOSAL | Status: AC
  Filled 2022-06-16: qty 15

## 2022-06-16 MED ORDER — LABETALOL HCL 5 MG/ML IV SOLN
INTRAVENOUS | Status: AC
  Filled 2022-06-16: qty 4

## 2022-06-16 MED ORDER — ALBUTEROL SULFATE HFA 108 (90 BASE) MCG/ACT IN AERS
INHALATION_SPRAY | RESPIRATORY_TRACT | Status: DC | PRN
  Administered 2022-06-16: 4 via RESPIRATORY_TRACT

## 2022-06-16 MED ORDER — LIDOCAINE-EPINEPHRINE 0.5 %-1:200000 IJ SOLN
INTRAMUSCULAR | Status: DC | PRN
  Administered 2022-06-16: 30 mL

## 2022-06-16 MED ORDER — PROPOFOL 10 MG/ML IV BOLUS
INTRAVENOUS | Status: DC | PRN
  Administered 2022-06-16: 160 mg via INTRAVENOUS

## 2022-06-16 MED ORDER — IOPAMIDOL (ISOVUE-300) INJECTION 61%
INTRAVENOUS | Status: DC | PRN
  Administered 2022-06-16: 100 mL

## 2022-06-16 MED ORDER — PHENYLEPHRINE HCL-NACL 20-0.9 MG/250ML-% IV SOLN
INTRAVENOUS | Status: DC | PRN
  Administered 2022-06-16: 25 ug/min via INTRAVENOUS

## 2022-06-16 MED ORDER — FENTANYL CITRATE (PF) 250 MCG/5ML IJ SOLN
INTRAMUSCULAR | Status: AC
  Filled 2022-06-16: qty 5

## 2022-06-16 MED ORDER — PROPOFOL 10 MG/ML IV BOLUS
INTRAVENOUS | Status: AC
  Filled 2022-06-16: qty 20

## 2022-06-16 MED ORDER — FENTANYL CITRATE (PF) 250 MCG/5ML IJ SOLN
INTRAMUSCULAR | Status: DC | PRN
  Administered 2022-06-16: 100 ug via INTRAVENOUS

## 2022-06-16 MED ORDER — ONDANSETRON HCL 4 MG/2ML IJ SOLN
INTRAMUSCULAR | Status: AC
  Filled 2022-06-16: qty 2

## 2022-06-16 MED ORDER — LIDOCAINE 2% (20 MG/ML) 5 ML SYRINGE
INTRAMUSCULAR | Status: AC
  Filled 2022-06-16: qty 5

## 2022-06-16 MED ORDER — CEFAZOLIN SODIUM-DEXTROSE 2-3 GM-%(50ML) IV SOLR
INTRAVENOUS | Status: DC | PRN
  Administered 2022-06-16: 2 g via INTRAVENOUS

## 2022-06-16 MED ORDER — ONDANSETRON HCL 4 MG/2ML IJ SOLN
INTRAMUSCULAR | Status: DC | PRN
  Administered 2022-06-16: 4 mg via INTRAVENOUS

## 2022-06-16 MED ORDER — DEXAMETHASONE SODIUM PHOSPHATE 10 MG/ML IJ SOLN
INTRAMUSCULAR | Status: DC | PRN
  Administered 2022-06-16: 5 mg via INTRAVENOUS

## 2022-06-16 MED ORDER — LIDOCAINE 2% (20 MG/ML) 5 ML SYRINGE
INTRAMUSCULAR | Status: DC | PRN
  Administered 2022-06-16: 60 mg via INTRAVENOUS

## 2022-06-16 MED ORDER — ALBUTEROL SULFATE HFA 108 (90 BASE) MCG/ACT IN AERS
INHALATION_SPRAY | RESPIRATORY_TRACT | Status: AC
  Filled 2022-06-16: qty 6.7

## 2022-06-16 MED ORDER — ORAL CARE MOUTH RINSE: 15.0000 mL | Freq: Once | OROMUCOSAL | Status: AC

## 2022-06-16 MED ORDER — DEXAMETHASONE SODIUM PHOSPHATE 10 MG/ML IJ SOLN
INTRAMUSCULAR | Status: AC
  Filled 2022-06-16: qty 1

## 2022-06-16 MED ORDER — LACTATED RINGERS IV SOLN: INTRAVENOUS | Status: DC

## 2022-06-16 MED ORDER — ESMOLOL HCL 100 MG/10ML IV SOLN
INTRAVENOUS | Status: AC
  Filled 2022-06-16: qty 10

## 2022-06-16 MED ORDER — CHLORHEXIDINE GLUCONATE 0.12 % MT SOLN
15.0000 mL | Freq: Once | OROMUCOSAL | Status: AC
  Administered 2022-06-16: 15 mL via OROMUCOSAL

## 2022-06-16 MED ORDER — ROCURONIUM BROMIDE 10 MG/ML (PF) SYRINGE
PREFILLED_SYRINGE | INTRAVENOUS | Status: DC | PRN
  Administered 2022-06-16: 50 mg via INTRAVENOUS

## 2022-06-16 MED ORDER — 0.9 % SODIUM CHLORIDE (POUR BTL) OPTIME
TOPICAL | Status: DC | PRN
  Administered 2022-06-16: 1000 mL

## 2022-06-16 MED ORDER — CEFAZOLIN SODIUM-DEXTROSE 2-4 GM/100ML-% IV SOLN
INTRAVENOUS | Status: AC
  Filled 2022-06-16: qty 100

## 2022-06-16 SURGICAL SUPPLY — 37 items
ADH SKN CLS APL DERMABOND .7 (GAUZE/BANDAGES/DRESSINGS) ×1
BAG COUNTER SPONGE SURGICOUNT (BAG) ×1 IMPLANT
BAG SPNG CNTER NS LX DISP (BAG) ×1
BLADE CLIPPER SURG (BLADE) IMPLANT
BLADE SURG 15 STRL LF DISP TIS (BLADE) ×1 IMPLANT
BLADE SURG 15 STRL SS (BLADE) ×1
CEMENT KYPHON C01A KIT/MIXER (Cement) IMPLANT
DERMABOND ADVANCED .7 DNX12 (GAUZE/BANDAGES/DRESSINGS) ×1 IMPLANT
DRAPE C-ARM 42X72 X-RAY (DRAPES) ×1 IMPLANT
DRAPE HALF SHEET 40X57 (DRAPES) ×1 IMPLANT
DRAPE LAPAROTOMY 100X72X124 (DRAPES) ×1 IMPLANT
DRAPE SURG 17X23 STRL (DRAPES) ×1 IMPLANT
DRAPE WARM FLUID 44X44 (DRAPES) ×1 IMPLANT
DURAPREP 26ML APPLICATOR (WOUND CARE) ×1 IMPLANT
GAUZE 4X4 16PLY ~~LOC~~+RFID DBL (SPONGE) ×1 IMPLANT
GLOVE ECLIPSE 6.5 STRL STRAW (GLOVE) ×1 IMPLANT
GLOVE EXAM NITRILE XL STR (GLOVE) IMPLANT
GOWN STRL REUS W/ TWL LRG LVL3 (GOWN DISPOSABLE) ×2 IMPLANT
GOWN STRL REUS W/ TWL XL LVL3 (GOWN DISPOSABLE) IMPLANT
GOWN STRL REUS W/TWL 2XL LVL3 (GOWN DISPOSABLE) IMPLANT
GOWN STRL REUS W/TWL LRG LVL3 (GOWN DISPOSABLE) ×2
GOWN STRL REUS W/TWL XL LVL3 (GOWN DISPOSABLE)
KIT BASIN OR (CUSTOM PROCEDURE TRAY) ×1 IMPLANT
KIT TURNOVER KIT B (KITS) ×1 IMPLANT
NDL HYPO 25X1 1.5 SAFETY (NEEDLE) ×1 IMPLANT
NEEDLE HYPO 25X1 1.5 SAFETY (NEEDLE) ×1 IMPLANT
NS IRRIG 1000ML POUR BTL (IV SOLUTION) ×1 IMPLANT
PACK BASIC III (CUSTOM PROCEDURE TRAY) ×1
PACK SRG BSC III STRL LF ECLPS (CUSTOM PROCEDURE TRAY) ×1 IMPLANT
PAD ARMBOARD 7.5X6 YLW CONV (MISCELLANEOUS) ×3 IMPLANT
SPECIMEN JAR SMALL (MISCELLANEOUS) IMPLANT
STAPLER SKIN PROX WIDE 3.9 (STAPLE) ×1 IMPLANT
SUT VIC AB 3-0 SH 8-18 (SUTURE) ×1 IMPLANT
SYR CONTROL 10ML LL (SYRINGE) ×2 IMPLANT
TOWEL GREEN STERILE (TOWEL DISPOSABLE) ×1 IMPLANT
TOWEL GREEN STERILE FF (TOWEL DISPOSABLE) ×1 IMPLANT
TRAY KYPHOPAK 15/3 ONESTEP 1ST (MISCELLANEOUS) IMPLANT

## 2022-06-16 NOTE — Anesthesia Procedure Notes (Signed)
Procedure Name: Intubation Date/Time: 06/16/2022 2:39 PM  Performed by: Glynda Jaeger, CRNAPre-anesthesia Checklist: Patient identified, Patient being monitored, Timeout performed, Emergency Drugs available and Suction available Patient Re-evaluated:Patient Re-evaluated prior to induction Oxygen Delivery Method: Circle System Utilized Preoxygenation: Pre-oxygenation with 100% oxygen Induction Type: IV induction Ventilation: Mask ventilation without difficulty Laryngoscope Size: Mac and 4 Grade View: Grade I Tube type: Oral Tube size: 7.5 mm Number of attempts: 1 Airway Equipment and Method: Stylet Placement Confirmation: ETT inserted through vocal cords under direct vision, positive ETCO2 and breath sounds checked- equal and bilateral Secured at: 23 cm Tube secured with: Tape Dental Injury: Teeth and Oropharynx as per pre-operative assessment

## 2022-06-16 NOTE — Transfer of Care (Signed)
Immediate Anesthesia Transfer of Care Note  Patient: Robert Pugh  Procedure(s) Performed: KYPHOPLASTY COMPRESSION FRACTURE THORACIC EIGHT  Patient Location: PACU  Anesthesia Type:General  Level of Consciousness: awake, alert , oriented, patient cooperative, and responds to stimulation  Airway & Oxygen Therapy: Patient Spontanous Breathing and Patient connected to face mask oxygen  Post-op Assessment: Report given to RN, Post -op Vital signs reviewed and stable, and Patient moving all extremities X 4  Post vital signs: Reviewed and stable  Last Vitals:  Vitals Value Taken Time  BP 143/91 06/16/22 1552  Temp    Pulse 81 06/16/22 1553  Resp 14 06/16/22 1553  SpO2 93 % 06/16/22 1553  Vitals shown include unvalidated device data.  Last Pain:  Vitals:   06/16/22 1159  TempSrc:   PainSc: 5          Complications: No notable events documented.

## 2022-06-16 NOTE — H&P (Signed)
BP (!) 152/93   Pulse 82   Temp 98.1 F (36.7 C) (Oral)   Resp 18   Ht 6\' 1"  (1.854 m)   Wt 74.4 kg   SpO2 93%   BMI 21.64 kg/m  Robert Pugh presents with a new compression frActure at T8. He is admitted for a kyphoplasty at T8.  Allergies  Allergen Reactions   Aspirin     stomach upset   Gabapentin     Arms and legs jerking, vision changes   Past Medical History:  Diagnosis Date   Arthritis    Bowel obstruction (HCC)    Chronic headaches    COPD (chronic obstructive pulmonary disease) (HCC)    Dyspnea    GERD (gastroesophageal reflux disease)    High blood pressure    Kidney stone    Pneumonia    Past Surgical History:  Procedure Laterality Date   APPENDECTOMY  09/2016   BACK SURGERY     HERNIA REPAIR Right 2006   KNEE SURGERY Left    KYPHOPLASTY  09/2019   KYPHOPLASTY N/A 04/28/2022   Procedure: T7 KYPHOPLASTY;  Surgeon: Ashok Pall, MD;  Location: [place];  Service: Neurosurgery;  Laterality: N/A;  RM 18 to follow 3C   Left shoulder repair      LUMBAR LAMINECTOMY/DECOMPRESSION MICRODISCECTOMY Right 12/06/2019   Procedure: Right Lumbar Three-Four Far Lateral Discectomy;  Surgeon: Ashok Pall, MD;  Location: [place];  Service: Neurosurgery;  Laterality: Right;   plural infusion  1991   ROTATOR CUFF REPAIR Right    Family History  Problem Relation Age of Onset   Diabetes Mother    Multiple sclerosis Mother    Heart disease Father    Congestive Heart Failure Father    Esophageal cancer Neg Hx    Colon cancer Neg Hx    Pancreatic cancer Neg Hx    Prostate cancer Neg Hx    There is no matching variant data for this patient.   Physical Exam Constitutional:      General: He is in acute distress.     Appearance: Normal appearance. He is normal weight.  HENT:     Head: Normocephalic and atraumatic.     Right Ear: External ear normal.     Left Ear: External ear normal.     Nose: Nose normal.     Mouth/Throat:     Mouth: Mucous membranes are moist.     Pharynx:  Oropharynx is clear.  Eyes:     Extraocular Movements: Extraocular movements intact.     Conjunctiva/sclera: Conjunctivae normal.     Pupils: Pupils are equal, round, and reactive to light.  Cardiovascular:     Rate and Rhythm: Normal rate and regular rhythm.  Pulmonary:     Effort: Pulmonary effort is normal.     Breath sounds: Normal breath sounds.  Abdominal:     General: Abdomen is flat.     Palpations: Abdomen is soft.  Musculoskeletal:        General: Normal range of motion.     Cervical back: Normal range of motion and neck supple.  Skin:    General: Skin is warm and dry.  Neurological:     General: No focal deficit present.     Mental Status: He is alert and oriented to person, place, and time.     Cranial Nerves: No cranial nerve deficit.     Sensory: No sensory deficit.     Motor: No weakness.     Coordination:  Coordination normal.     Gait: Gait normal.     Deep Tendon Reflexes: Reflexes normal.  Psychiatric:        Mood and Affect: Mood normal.        Thought Content: Thought content normal.        Judgment: Judgment normal.    OR for kyphoplasty. T8. He has previously had a kyphoplasty and is familiar with the procedure. He would like to proceed

## 2022-06-16 NOTE — Op Note (Signed)
06/16/2022  4:23 PM  PATIENT:  Robert Pugh  72 y.o. male  PRE-OPERATIVE DIAGNOSIS:  Wedge compression fracture of thoracic vertebra eight POST-OPERATIVE DIAGNOSIS:  Wedge compression fracture of thoracic vertebra eight PROCEDURE:  Procedure(s): KYPHOPLASTY COMPRESSION FRACTURE THORACIC EIGHT  SURGEON:  Surgeon(s): Ashok Pall, MD  ANESTHESIA:   general and IV sedation  EBL:  Total I/O In: 50 [IV Piggyback:50] Out: -   BLOOD ADMINISTERED:none  COUNT:per nursing  SPECIMEN:  No Specimen  DICTATION: Mr. [name] was taken to the operating room, intubated and placed under a general anesthetic without difficulty. He was positioned prone on the operating room table with all pressure points properly padded. Her back was prepped and draped in a sterile manner. With fluoroscopy I localized the T8 pedicles bilaterally. I injected lidocaine into the entry sites on both the left and right sides. I started by making a stab incision on the right and left sides and entering the T8 pedicles with fluoroscopic guidance. Once good position was obtained, I drilled into the vertebral body. I then placed the kyphoplasty balloon into the T8 vertebra and inflated the balloon. I then inserted 7.5 of methylmethacrylate into the vertebral body under fluoroscopic guidance. I achieved a good fill of the cavity, staying within the confines of the vertebral body. I removed the instrumentation from the vertebral body, and the final films looked good. I closed the stab incision with vicryl suture and used Dermabond for a sterile dressing. Marland Kitchen    PLAN OF CARE: Admit to inpatient   PATIENT DISPOSITION:  PACU - hemodynamically stable.   Delay start of Pharmacological VTE agent (>24hrs) due to surgical blood loss or risk of bleeding:  yes

## 2022-06-17 ENCOUNTER — Encounter (HOSPITAL_COMMUNITY): Payer: Self-pay | Admitting: Neurosurgery

## 2022-06-17 NOTE — Anesthesia Postprocedure Evaluation (Signed)
Anesthesia Post Note  Patient: Robert Pugh  Procedure(s) Performed: KYPHOPLASTY COMPRESSION FRACTURE THORACIC EIGHT     Patient location during evaluation: PACU Anesthesia Type: General Level of consciousness: awake and alert Pain management: pain level controlled Vital Signs Assessment: post-procedure vital signs reviewed and stable Respiratory status: spontaneous breathing, nonlabored ventilation, respiratory function stable and patient connected to nasal cannula oxygen Cardiovascular status: blood pressure returned to baseline and stable Postop Assessment: no apparent nausea or vomiting Anesthetic complications: no   No notable events documented.  Last Vitals:  Vitals:   06/16/22 1615 06/16/22 1630  BP: (!) 139/98 (!) 116/95  Pulse: 75 72  Resp: 13 15  Temp:  36.7 C  SpO2: 92% 92%    Last Pain:  Vitals:   06/16/22 1615  TempSrc:   PainSc: 0-No pain                 Belenda Cruise P Latrelle Fuston
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
## 2022-08-17 ENCOUNTER — Encounter: Payer: Self-pay | Admitting: Internal Medicine

## 2022-08-17 ENCOUNTER — Ambulatory Visit: Payer: Medicare Other | Admitting: Internal Medicine

## 2022-08-17 VITALS — BP 136/86 | HR 94 | Ht 73.0 in | Wt 164.6 lb

## 2022-08-17 DIAGNOSIS — F17210 Nicotine dependence, cigarettes, uncomplicated: Secondary | ICD-10-CM

## 2022-08-17 DIAGNOSIS — J449 Chronic obstructive pulmonary disease, unspecified: Secondary | ICD-10-CM

## 2022-08-17 MED ORDER — ALBUTEROL SULFATE HFA 108 (90 BASE) MCG/ACT IN AERS: INHALATION_SPRAY | RESPIRATORY_TRACT | 5 refills | Status: DC

## 2022-08-17 MED ORDER — STIOLTO RESPIMAT 2.5-2.5 MCG/ACT IN AERS: 2.0000 | INHALATION_SPRAY | Freq: Every day | RESPIRATORY_TRACT | 11 refills | Status: DC

## 2022-08-17 NOTE — Patient Instructions (Signed)
Plan A = Automatic = Always=    Stiolto 2 puff first thing am   Plan B = Backup (to supplement plan A, not to replace it) Only use your albuterol inhaler as a rescue medication to be used if you can't catch your breath by resting or doing a relaxed purse lip breathing pattern.  - The less you use it, the better it will work when you need it. - Ok to use the inhaler up to 2 puffs  every 4 hours if you must but call for appointment if use goes up over your usual need - Don't leave home without it !!  (think of it like the spare tire for your car)   Also  Ok to try albuterol 15 min before an activity (on alternating days)  that you know would usually make you short of breath and see if it makes any difference and if makes none then don't take albuterol after activity unless you can't catch your breath as this means it's the resting that helps, not the albuterol.      Please schedule a follow up visit in 6  months but call sooner if needed

## 2022-08-17 NOTE — Assessment & Plan Note (Signed)
Counseled re importance of smoking cessation but did not meet time criteria for separate billing           Each maintenance medication was reviewed in detail including emphasizing most importantly the difference between maintenance and prns and under what circumstances the prns are to be triggered using an action plan format where appropriate.  Total time for H and P, chart review, counseling, reviewing smi/hfa device(s) and generating customized AVS unique to this office visit / same day charting  > 30 min

## 2022-08-17 NOTE — Assessment & Plan Note (Signed)
Active smoker/ MZ  Spirometry 06/08/2017  FEV1 1.43 (36%)  Ratio 41 with classic curvature   - 06/08/2017   stiolto sample > did not tol due to muscle cramps in neck and shoulders - 06/28/2017  After extensive coaching inhaler device  effectiveness =    90% > try dulera 200 2bid and pred 20 mg daily until better then 10 mg daily > much better 07/07/2017  - PFT's  07/07/2017  FEV1 1.70 (44 % ) ratio 42  p 5 % improvement from saba p dulera 200 prior to study with DLCO  34/37 % corrects to 92  % for alv volume   - 07/07/2017 taper off pred x 2 weeks  - alpha one screening 07/07/2017  >  MZ level 98   - 08/24/2017   try add spiriva 2 respimat each am - 10/09/17 placed on daily prednisone   - 06/27/2018    Changed to bevespi req by insurance  - 01/10/2019  After extensive coaching inhaler device,  effectiveness =    90% with smi, try again to see if insurance will cover stiolto   -  01/10/2019   Walked RA x one lap =  approx 250 ft - stopped due to sob with sats 92% @ slow pace    -  07/06/2020  After extensive coaching inhaler device,  effectiveness =    80% breztri Take 2 puffs first thing in am and then another 2 puffs about 12 hours later.  - 02/09/2021 establish floor of 5 mg pred daily for copd/ 10 mg ceiling    Group D (now reclassified as E) in terms of symptom/risk and laba/lama/ICS  therefore appropriate rx at this point >>>  stiolto plus daily pred needed by rheum should be eqiuvalent ot breztri  - The proper method of use, as well as anticipated side effects, of a metered-dose inhaler were discussed and demonstrated to the patient using teach back method. Improved effectiveness after extensive coaching during this visit to a level of approximately 90 % from a baseline of 60% > stiolto trial

## 2022-08-26 ENCOUNTER — Telehealth: Payer: Self-pay

## 2022-08-26 NOTE — Telephone Encounter (Signed)
Dr Sherene Sires,  Please confirm or deny:  Wanted to confirm if patient is still on Stiolto- previous notes say that this made patient worse and most recent notes state he is on [place], please advise.

## 2022-08-26 NOTE — Telephone Encounter (Signed)
PA request received via CMM for Stiolto Respimat 2.5-2.5MCG/ACT aerosol  Key: [id number]

## 2022-08-28 NOTE — Telephone Encounter (Signed)
My info was that stiolto might not be covered, not that he could not tolerate it .  If is not covered or there is h/o intol to stiolto then just change back to breztri 2 bid until next ov.   If he tried the sample I gave him, which according to AVS was his new Plan A (to replace the breztri) then we need to know if he tolerated  it or benefitted from it.    If he continued breztri at the same time, then all bets are off both for eval of response and side effects .

## 2022-11-22 ENCOUNTER — Ambulatory Visit (HOSPITAL_COMMUNITY)
Admission: RE | Admit: 2022-11-22 | Discharge: 2022-11-22 | Disposition: A | Discharge status: Home / Self Care | Payer: Medicare Other | Source: Ambulatory Visit | Attending: Family Medicine | Admitting: Family Medicine

## 2022-11-22 DIAGNOSIS — I2510 Atherosclerotic heart disease of native coronary artery without angina pectoris: Secondary | ICD-10-CM | POA: Insufficient documentation

## 2022-11-22 DIAGNOSIS — Z122 Encounter for screening for malignant neoplasm of respiratory organs: Secondary | ICD-10-CM | POA: Diagnosis present

## 2022-11-22 DIAGNOSIS — I700 Atherosclerosis of aorta: Secondary | ICD-10-CM | POA: Insufficient documentation

## 2022-11-22 DIAGNOSIS — Z87891 Personal history of nicotine dependence: Secondary | ICD-10-CM | POA: Diagnosis present

## 2022-11-22 DIAGNOSIS — F17210 Nicotine dependence, cigarettes, uncomplicated: Secondary | ICD-10-CM | POA: Diagnosis present

## 2022-11-22 DIAGNOSIS — J439 Emphysema, unspecified: Secondary | ICD-10-CM | POA: Insufficient documentation

## 2022-11-22 DIAGNOSIS — I10 Essential (primary) hypertension: Secondary | ICD-10-CM | POA: Insufficient documentation

## 2022-11-28 ENCOUNTER — Other Ambulatory Visit: Payer: Self-pay

## 2022-11-28 DIAGNOSIS — Z122 Encounter for screening for malignant neoplasm of respiratory organs: Secondary | ICD-10-CM

## 2022-11-28 DIAGNOSIS — F17210 Nicotine dependence, cigarettes, uncomplicated: Secondary | ICD-10-CM

## 2022-11-28 DIAGNOSIS — Z87891 Personal history of nicotine dependence: Secondary | ICD-10-CM
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
## 2023-02-02 ENCOUNTER — Ambulatory Visit: Payer: Medicare Other | Admitting: Internal Medicine

## 2023-02-02 ENCOUNTER — Encounter: Payer: Self-pay | Admitting: Internal Medicine

## 2023-02-02 VITALS — BP 133/84 | HR 86 | Ht 73.0 in | Wt 158.6 lb

## 2023-02-02 DIAGNOSIS — J449 Chronic obstructive pulmonary disease, unspecified: Secondary | ICD-10-CM | POA: Diagnosis not present

## 2023-02-02 DIAGNOSIS — F17210 Nicotine dependence, cigarettes, uncomplicated: Secondary | ICD-10-CM

## 2023-02-02 DIAGNOSIS — R0789 Other chest pain: Secondary | ICD-10-CM

## 2023-02-02 MED ORDER — BEVESPI AEROSPHERE 9-4.8 MCG/ACT IN AERO: INHALATION_SPRAY | RESPIRATORY_TRACT | 11 refills | Status: DC

## 2023-02-02 NOTE — Assessment & Plan Note (Signed)
Counseled re importance of smoking cessation but did not meet time criteria for separate billing   °

## 2023-02-02 NOTE — Patient Instructions (Addendum)
Bevespi may be an alternative to [place] if [employer] won't cover. - dose is Take 2 puffs first thing in am and then another 2 puffs about 12 hours later to see if nausea subsides    Call us with the info we need for your jury duty excuse   Ok to try albuterol 15 min before an activity (on alternating days)  that you know would usually make you short of breath and see if it makes any difference and if makes none then don't take albuterol after activity unless you can't catch your breath as this means it's the resting that helps, not the albuterol.  The pain under your L rib may just be gas pain from adhesions from surgery:  Treatment consists of avoiding foods that cause gas (especially boiled eggs, mexcican food but especially  beans and undercooked vegetables like  spinach and some salads)  and citrucel 1 heaping tbsp twice daily with a large glass of water.  Pain should improve w/in 2 weeks if that's what's causing the pain   Please schedule a follow up visit in 6 months but call sooner if needed

## 2023-02-02 NOTE — Assessment & Plan Note (Addendum)
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  Total time for H and P, chart review, counseling, reviewing hfa device(s) , directly observing portions of ambulatory 02 saturation study/ and generating customized AVS unique to this office visit / same day charting  > 30 min for multiple  refractory chronic respiratory /chest symptoms and possible side  effects of meds

## 2023-02-02 NOTE — Assessment & Plan Note (Addendum)
Active smoker/ MZ  Spirometry 06/08/2017  FEV1 1.43 (36%)  Ratio 41 with classic curvature   - 06/08/2017   stiolto sample > did not tol due to muscle cramps in neck and shoulders - 06/28/2017  After extensive coaching inhaler device  effectiveness =    90% > try dulera 200 2bid and pred 20 mg daily until better then 10 mg daily > much better 07/07/2017  - PFT's  07/07/2017  FEV1 1.70 (44 % ) ratio 42  p 5 % improvement from saba p dulera 200 prior to study with DLCO  34/37 % corrects to 92  % for alv volume   - 07/07/2017 taper off pred x 2 weeks  - alpha one screening 07/07/2017  >  MZ level 98   - 08/24/2017   try add spiriva 2 respimat each am - 10/09/17 placed on daily prednisone   - 06/27/2018    Changed to bevespi req by insurance  - 01/10/2019  After extensive coaching inhaler device,  effectiveness =    90% with smi, try again to see if insurance will cover stiolto   -  01/10/2019   Walked RA x one lap =  approx 250 ft - stopped due to sob with sats 92% @ slow pace    -  07/06/2020    breztri  rx - 02/09/2021 establish floor of 5 mg pred daily for copd/ 10 mg ceiling  - 02/02/2023   Walked on RA  x  2  lap(s) =  approx 300  ft  @ slow/mod pace, stopped due to legs gave out with lowest 02 sats 91%     Group D (now reclassified as E) in terms of symptom/risk and laba/lama/ICS  therefore appropriate rx at this point >>>  breztri best choice ? Side effects = nausea    >>>  02/02/2023  After extensive coaching inhaler device,  effectiveness =    80%> try bevespi due to nausea ? From breztri  since maint on low dose prednisone anyway for RA control

## 2023-06-07 ENCOUNTER — Encounter (HOSPITAL_COMMUNITY): Payer: Self-pay

## 2023-06-07 ENCOUNTER — Emergency Department (HOSPITAL_COMMUNITY): Payer: Medicare Other

## 2023-06-07 ENCOUNTER — Other Ambulatory Visit: Payer: Self-pay

## 2023-06-07 ENCOUNTER — Inpatient Hospital Stay (HOSPITAL_COMMUNITY)
Admission: EM | Admit: 2023-06-07 | Discharge: 2023-06-09 | DRG: 193 | Disposition: A | Discharge status: Home / Self Care | Payer: Medicare Other | Attending: Family Medicine | Admitting: Family Medicine

## 2023-06-07 DIAGNOSIS — M069 Rheumatoid arthritis, unspecified: Secondary | ICD-10-CM | POA: Diagnosis present

## 2023-06-07 DIAGNOSIS — J440 Chronic obstructive pulmonary disease with acute lower respiratory infection: Secondary | ICD-10-CM | POA: Diagnosis present

## 2023-06-07 DIAGNOSIS — Z888 Allergy status to other drugs, medicaments and biological substances status: Secondary | ICD-10-CM | POA: Diagnosis not present

## 2023-06-07 DIAGNOSIS — J9601 Acute respiratory failure with hypoxia: Secondary | ICD-10-CM | POA: Diagnosis present

## 2023-06-07 DIAGNOSIS — Z886 Allergy status to analgesic agent status: Secondary | ICD-10-CM

## 2023-06-07 DIAGNOSIS — D72829 Elevated white blood cell count, unspecified: Secondary | ICD-10-CM | POA: Diagnosis present

## 2023-06-07 DIAGNOSIS — J441 Chronic obstructive pulmonary disease with (acute) exacerbation: Secondary | ICD-10-CM | POA: Diagnosis present

## 2023-06-07 DIAGNOSIS — F17210 Nicotine dependence, cigarettes, uncomplicated: Secondary | ICD-10-CM | POA: Diagnosis present

## 2023-06-07 DIAGNOSIS — M5126 Other intervertebral disc displacement, lumbar region: Secondary | ICD-10-CM | POA: Diagnosis present

## 2023-06-07 DIAGNOSIS — Z1152 Encounter for screening for COVID-19: Secondary | ICD-10-CM | POA: Diagnosis not present

## 2023-06-07 DIAGNOSIS — Z87442 Personal history of urinary calculi: Secondary | ICD-10-CM

## 2023-06-07 DIAGNOSIS — I10 Essential (primary) hypertension: Secondary | ICD-10-CM | POA: Diagnosis present

## 2023-06-07 DIAGNOSIS — Z79899 Other long term (current) drug therapy: Secondary | ICD-10-CM

## 2023-06-07 DIAGNOSIS — Z8249 Family history of ischemic heart disease and other diseases of the circulatory system: Secondary | ICD-10-CM | POA: Diagnosis not present

## 2023-06-07 DIAGNOSIS — J189 Pneumonia, unspecified organism: Principal | ICD-10-CM | POA: Diagnosis present

## 2023-06-07 DIAGNOSIS — Z7952 Long term (current) use of systemic steroids: Secondary | ICD-10-CM | POA: Diagnosis not present

## 2023-06-07 DIAGNOSIS — R0609 Other forms of dyspnea: Secondary | ICD-10-CM | POA: Diagnosis present

## 2023-06-07 DIAGNOSIS — R0902 Hypoxemia: Principal | ICD-10-CM

## 2023-06-07 DIAGNOSIS — J449 Chronic obstructive pulmonary disease, unspecified: Secondary | ICD-10-CM | POA: Diagnosis present

## 2023-06-07 DIAGNOSIS — R058 Other specified cough: Secondary | ICD-10-CM | POA: Diagnosis present

## 2023-06-07 LAB — CBC WITH DIFFERENTIAL/PLATELET
Abs Immature Granulocytes: 0.39 10*3/uL — ABNORMAL HIGH (ref 0.00–0.07)
Basophils Absolute: 0.1 10*3/uL (ref 0.0–0.1)
Basophils Relative: 0 %
Eosinophils Absolute: 0 10*3/uL (ref 0.0–0.5)
Eosinophils Relative: 0 %
HCT: 48.1 % (ref 39.0–52.0)
Hemoglobin: 15.4 g/dL (ref 13.0–17.0)
Immature Granulocytes: 1 %
Lymphocytes Relative: 3 %
Lymphs Abs: 0.9 10*3/uL (ref 0.7–4.0)
MCH: 21.3 pg — ABNORMAL LOW (ref 26.0–34.0)
MCHC: 32 g/dL (ref 30.0–36.0)
MCV: 66.4 fL — ABNORMAL LOW (ref 80.0–100.0)
Monocytes Absolute: 2.6 10*3/uL — ABNORMAL HIGH (ref 0.1–1.0)
Monocytes Relative: 8 %
Neutro Abs: 30.8 10*3/uL — ABNORMAL HIGH (ref 1.7–7.7)
Neutrophils Relative %: 88 %
Platelets: 430 10*3/uL — ABNORMAL HIGH (ref 150–400)
RBC: 7.24 MIL/uL — ABNORMAL HIGH (ref 4.22–5.81)
RDW: 18.4 % — ABNORMAL HIGH (ref 11.5–15.5)
WBC: 34.9 10*3/uL — ABNORMAL HIGH (ref 4.0–10.5)
nRBC: 0 % (ref 0.0–0.2)

## 2023-06-07 LAB — COMPREHENSIVE METABOLIC PANEL
ALT: 15 U/L (ref 0–44)
AST: 17 U/L (ref 15–41)
Albumin: 3.1 g/dL — ABNORMAL LOW (ref 3.5–5.0)
Alkaline Phosphatase: 87 U/L (ref 38–126)
Anion gap: 10 (ref 5–15)
BUN: 15 mg/dL (ref 8–23)
CO2: 21 mmol/L — ABNORMAL LOW (ref 22–32)
Calcium: 8.7 mg/dL — ABNORMAL LOW (ref 8.9–10.3)
Chloride: 102 mmol/L (ref 98–111)
Creatinine, Ser: 0.69 mg/dL (ref 0.61–1.24)
GFR, Estimated: >60 mL/min (ref >60)
Glucose, Bld: 115 mg/dL — ABNORMAL HIGH (ref 70–99)
Potassium: 3.8 mmol/L (ref 3.5–5.1)
Sodium: 133 mmol/L — ABNORMAL LOW (ref 135–145)
Total Bilirubin: 1.2 mg/dL (ref 0.0–1.2)
Total Protein: 6.5 g/dL (ref 6.5–8.1)

## 2023-06-07 LAB — RESP PANEL BY RT-PCR (RSV, FLU A&B, COVID)  RVPGX2
Influenza A by PCR: NEGATIVE
Influenza B by PCR: NEGATIVE
Resp Syncytial Virus by PCR: NEGATIVE
SARS Coronavirus 2 by RT PCR: NEGATIVE

## 2023-06-07 LAB — TROPONIN I (HIGH SENSITIVITY)
Troponin I (High Sensitivity): 7 ng/L (ref <18)
Troponin I (High Sensitivity): 6 ng/L (ref <18)

## 2023-06-07 MED ORDER — PANTOPRAZOLE SODIUM 40 MG PO TBEC
40.0000 mg | DELAYED_RELEASE_TABLET | Freq: Every day | ORAL | Status: DC
  Administered 2023-06-07 – 2023-06-08 (×2): 40 mg via ORAL
  Filled 2023-06-07 (×2): qty 1

## 2023-06-07 MED ORDER — METHYLPREDNISOLONE SODIUM SUCC 40 MG IJ SOLR
40.0000 mg | Freq: Every day | INTRAMUSCULAR | Status: DC
  Administered 2023-06-08 – 2023-06-09 (×2): 40 mg via INTRAVENOUS
  Filled 2023-06-07 (×2): qty 1

## 2023-06-07 MED ORDER — CLONIDINE HCL 0.1 MG PO TABS
0.1000 mg | ORAL_TABLET | Freq: Two times a day (BID) | ORAL | Status: DC
  Administered 2023-06-07 – 2023-06-09 (×4): 0.1 mg via ORAL
  Filled 2023-06-07 (×4): qty 1

## 2023-06-07 MED ORDER — DEXTROMETHORPHAN POLISTIREX ER 30 MG/5ML PO SUER: 30.0000 mg | Freq: Two times a day (BID) | ORAL | Status: DC | PRN

## 2023-06-07 MED ORDER — GUAIFENESIN ER 600 MG PO TB12
1200.0000 mg | ORAL_TABLET | Freq: Two times a day (BID) | ORAL | Status: DC
  Administered 2023-06-07 – 2023-06-09 (×5): 1200 mg via ORAL
  Filled 2023-06-07 (×6): qty 2

## 2023-06-07 MED ORDER — NICOTINE 21 MG/24HR TD PT24
21.0000 mg | MEDICATED_PATCH | Freq: Every day | TRANSDERMAL | Status: DC
  Administered 2023-06-07 – 2023-06-09 (×3): 21 mg via TRANSDERMAL
  Filled 2023-06-07 (×3): qty 1

## 2023-06-07 MED ORDER — IPRATROPIUM-ALBUTEROL 0.5-2.5 (3) MG/3ML IN SOLN
3.0000 mL | Freq: Once | RESPIRATORY_TRACT | Status: AC
  Administered 2023-06-07: 3 mL via RESPIRATORY_TRACT
  Filled 2023-06-07: qty 3

## 2023-06-07 MED ORDER — ACETAMINOPHEN 325 MG PO TABS: 650.0000 mg | ORAL_TABLET | Freq: Four times a day (QID) | ORAL | Status: DC | PRN

## 2023-06-07 MED ORDER — ACETAMINOPHEN 650 MG RE SUPP: 650.0000 mg | Freq: Four times a day (QID) | RECTAL | Status: DC | PRN

## 2023-06-07 MED ORDER — UMECLIDINIUM BROMIDE 62.5 MCG/ACT IN AEPB: 1.0000 | INHALATION_SPRAY | Freq: Every day | RESPIRATORY_TRACT | Status: DC

## 2023-06-07 MED ORDER — OXYCODONE HCL 5 MG PO TABS
5.0000 mg | ORAL_TABLET | ORAL | Status: DC | PRN
  Administered 2023-06-07 – 2023-06-09 (×11): 5 mg via ORAL
  Filled 2023-06-07 (×11): qty 1

## 2023-06-07 MED ORDER — LEVOFLOXACIN 500 MG PO TABS
750.0000 mg | ORAL_TABLET | Freq: Every day | ORAL | Status: DC
  Administered 2023-06-08 – 2023-06-09 (×2): 750 mg via ORAL
  Filled 2023-06-07 (×2): qty 2

## 2023-06-07 MED ORDER — VERAPAMIL HCL ER 240 MG PO TBCR
120.0000 mg | EXTENDED_RELEASE_TABLET | Freq: Every day | ORAL | Status: DC
  Administered 2023-06-07 – 2023-06-08 (×2): 120 mg via ORAL
  Filled 2023-06-07 (×3): qty 1

## 2023-06-07 MED ORDER — ARFORMOTEROL TARTRATE 15 MCG/2ML IN NEBU: 15.0000 ug | INHALATION_SOLUTION | Freq: Two times a day (BID) | RESPIRATORY_TRACT | Status: DC

## 2023-06-07 MED ORDER — ONDANSETRON HCL 4 MG PO TABS: 4.0000 mg | ORAL_TABLET | Freq: Four times a day (QID) | ORAL | Status: DC | PRN

## 2023-06-07 MED ORDER — ARFORMOTEROL TARTRATE 15 MCG/2ML IN NEBU
15.0000 ug | INHALATION_SOLUTION | Freq: Two times a day (BID) | RESPIRATORY_TRACT | Status: DC
  Administered 2023-06-08 – 2023-06-09 (×3): 15 ug via RESPIRATORY_TRACT
  Filled 2023-06-07 (×3): qty 2

## 2023-06-07 MED ORDER — ONDANSETRON HCL 4 MG/2ML IJ SOLN
4.0000 mg | Freq: Four times a day (QID) | INTRAMUSCULAR | Status: DC | PRN
Start: 2023-06-07 — End: 2023-06-09

## 2023-06-07 MED ORDER — FENTANYL CITRATE PF 50 MCG/ML IJ SOSY: 12.5000 ug | PREFILLED_SYRINGE | INTRAMUSCULAR | Status: DC | PRN

## 2023-06-07 MED ORDER — UMECLIDINIUM BROMIDE 62.5 MCG/ACT IN AEPB
1.0000 | INHALATION_SPRAY | Freq: Every day | RESPIRATORY_TRACT | Status: DC
  Administered 2023-06-08 – 2023-06-09 (×2): 1 via RESPIRATORY_TRACT
  Filled 2023-06-07: qty 7

## 2023-06-07 MED ORDER — ENOXAPARIN SODIUM 40 MG/0.4ML IJ SOSY
40.0000 mg | PREFILLED_SYRINGE | INTRAMUSCULAR | Status: DC
  Administered 2023-06-07 – 2023-06-08 (×2): 40 mg via SUBCUTANEOUS
  Filled 2023-06-07 (×2): qty 0.4

## 2023-06-07 MED ORDER — ALBUTEROL SULFATE (2.5 MG/3ML) 0.083% IN NEBU
2.5000 mg | INHALATION_SOLUTION | RESPIRATORY_TRACT | Status: DC
  Administered 2023-06-07 (×2): 2.5 mg via RESPIRATORY_TRACT
  Filled 2023-06-07 (×2): qty 3

## 2023-06-07 MED ORDER — ACETAMINOPHEN 500 MG PO TABS
1000.0000 mg | ORAL_TABLET | Freq: Once | ORAL | Status: AC
  Administered 2023-06-07: 1000 mg via ORAL
  Filled 2023-06-07: qty 2

## 2023-06-07 MED ORDER — METHYLPREDNISOLONE SODIUM SUCC 40 MG IJ SOLR: 40.0000 mg | Freq: Two times a day (BID) | INTRAMUSCULAR | Status: DC

## 2023-06-07 MED ORDER — SODIUM CHLORIDE 0.9 % IV SOLN
500.0000 mg | Freq: Once | INTRAVENOUS | Status: AC
  Administered 2023-06-07: 500 mg via INTRAVENOUS
  Filled 2023-06-07: qty 5

## 2023-06-07 MED ORDER — ALBUTEROL SULFATE HFA 108 (90 BASE) MCG/ACT IN AERS
2.0000 | INHALATION_SPRAY | RESPIRATORY_TRACT | Status: DC | PRN
  Administered 2023-06-07: 2 via RESPIRATORY_TRACT
  Filled 2023-06-07: qty 6.7

## 2023-06-07 MED ORDER — ALBUTEROL SULFATE (2.5 MG/3ML) 0.083% IN NEBU: 2.5000 mg | INHALATION_SOLUTION | RESPIRATORY_TRACT | Status: DC | PRN

## 2023-06-07 MED ORDER — SODIUM CHLORIDE 0.9 % IV SOLN
1.0000 g | Freq: Once | INTRAVENOUS | Status: AC
  Administered 2023-06-07: 1 g via INTRAVENOUS
  Filled 2023-06-07: qty 10

## 2023-06-07 MED ORDER — METHYLPREDNISOLONE SODIUM SUCC 125 MG IJ SOLR
125.0000 mg | Freq: Once | INTRAMUSCULAR | Status: AC
  Administered 2023-06-07: 125 mg via INTRAVENOUS
  Filled 2023-06-07: qty 2

## 2023-06-07 MED ORDER — ALBUTEROL SULFATE (2.5 MG/3ML) 0.083% IN NEBU
2.5000 mg | INHALATION_SOLUTION | Freq: Four times a day (QID) | RESPIRATORY_TRACT | Status: DC
  Administered 2023-06-08 – 2023-06-09 (×6): 2.5 mg via RESPIRATORY_TRACT
  Filled 2023-06-07 (×6): qty 3

## 2023-06-07 MED ORDER — METHYLPREDNISOLONE SODIUM SUCC 40 MG IJ SOLR: 40.0000 mg | Freq: Every day | INTRAMUSCULAR | Status: DC

## 2023-06-07 NOTE — ED Provider Notes (Addendum)
 [facility] AT [facility] Provider Note   CSN: [id number] Arrival date & time: 06/07/23  0957     History  Chief Complaint  Patient presents with   Shortness of Breath    Robert Pugh is a 73 y.o. male.  Patient came in by POV worsening shortness of breath.  Patient does not use oxygen at home.  He is still an active smoker.  O2 sats at home are in the 70s.  Patient here sats were below 90 on room air 87% in triage.  Patient started on 2 L nasal cannula oxygen.  He is now on 3 L nasal cannula oxygen satting in the low 90s.  He reports he does have a history of COPD.  Not currently on steroids.  Has had some cough and congestion.  But does not feel like he is got an upper respiratory infection.  Past medical history significant for high blood pressure COPD.  Past surgical history significant for appendectomy patient is an everyday smoker half a pack a day.       Home Medications Prior to Admission medications   Medication Sig Start Date End Date Taking? Authorizing Provider  albuterol  (PROAIR  HFA) 108 (90 Base) MCG/ACT inhaler 2 puffs every 4 hours as needed only  if your can't catch your breath 08/17/22  Yes Diamond Formica, MD  cloNIDine  (CATAPRES ) 0.1 MG tablet Take 1 tablet (0.1 mg total) by mouth 2 (two) times daily. 01/26/18  Yes Diamond Formica, MD  Glycopyrrolate -Formoterol  (BEVESPI  AEROSPHERE) 9-4.8 MCG/ACT AERO Take 2 puffs first thing in am and then another 2 puffs about 12 hours later. 02/02/23  Yes Diamond Formica, MD  naproxen sodium (ALEVE) 220 MG tablet Take 220 mg by mouth daily as needed (pain).   Yes [provider]  predniSONE  (DELTASONE ) 5 MG tablet Take 5 mg by mouth daily with breakfast.   Yes [provider]  verapamil  (CALAN -SR) 120 MG CR tablet Take 120 mg by mouth daily. 04/13/22  Yes [provider]  chlorpheniramine (CHLOR-TRIMETON) 4 MG tablet Take 4 mg by mouth every 4 (four) hours as needed for  allergies. Patient not taking: Reported on 06/07/2023    [provider]      Allergies    Aspirin and Gabapentin     Review of Systems   Review of Systems  Constitutional:  Negative for chills and fever.  HENT:  Negative for ear pain and sore throat.   Eyes:  Negative for pain and visual disturbance.  Respiratory:  Positive for cough and shortness of breath.   Cardiovascular:  Negative for chest pain and palpitations.  Gastrointestinal:  Negative for abdominal pain and vomiting.  Genitourinary:  Negative for dysuria and hematuria.  Musculoskeletal:  Negative for arthralgias and back pain.  Skin:  Negative for color change and rash.  Neurological:  Negative for seizures and syncope.  All other systems reviewed and are negative.   Physical Exam Updated Vital Signs BP 119/72 (BP Location: Left Arm)   Pulse 86   Temp 98.4 F (36.9 C) (Oral)   Resp (!) 32   Ht 1.854 m (6\' 1" )   Wt 72 kg   SpO2 93%   BMI 20.94 kg/m  Physical Exam Vitals and nursing note reviewed.  Constitutional:      General: He is not in acute distress.    Appearance: He is well-developed. He is not ill-appearing.  HENT:     Head: Normocephalic and atraumatic.  Mouth/Throat:     Mouth: Mucous membranes are moist.  Eyes:     Conjunctiva/sclera: Conjunctivae normal.  Cardiovascular:     Rate and Rhythm: Normal rate and regular rhythm.     Heart sounds: No murmur heard. Pulmonary:     Effort: Pulmonary effort is normal. No respiratory distress.     Breath sounds: Normal breath sounds. No wheezing, rhonchi or rales.     Comments: Decreased breath sounds bilaterally Abdominal:     Palpations: Abdomen is soft.     Tenderness: There is no abdominal tenderness.  Musculoskeletal:        General: No swelling.     Cervical back: Neck supple.  Skin:    General: Skin is warm and dry.     Capillary Refill: Capillary refill takes less than 2 seconds.  Neurological:     General: No focal deficit  present.     Mental Status: He is alert and oriented to person, place, and time.  Psychiatric:        Mood and Affect: Mood normal.     ED Results / Procedures / Treatments   Labs (all labs ordered are listed, but only abnormal results are displayed) Labs Reviewed  CBC WITH DIFFERENTIAL/PLATELET - Abnormal; Notable for the following components:      Result Value   WBC 34.9 (*)    RBC 7.24 (*)    MCV 66.4 (*)    MCH 21.3 (*)    RDW 18.4 (*)    Platelets 430 (*)    Neutro Abs 30.8 (*)    Monocytes Absolute 2.6 (*)    Abs Immature Granulocytes 0.39 (*)    All other components within normal limits  COMPREHENSIVE METABOLIC PANEL - Abnormal; Notable for the following components:   Sodium 133 (*)    CO2 21 (*)    Glucose, Bld 115 (*)    Calcium 8.7 (*)    Albumin 3.1 (*)    All other components within normal limits  RESP PANEL BY RT-PCR (RSV, FLU A&B, COVID)  RVPGX2  TROPONIN I (HIGH SENSITIVITY)  TROPONIN I (HIGH SENSITIVITY)    EKG EKG Interpretation Date/Time:  Wednesday June 07 2023 10:43:44 EST Ventricular Rate:  90 PR Interval:  164 QRS Duration:  103 QT Interval:  363 QTC Calculation: 445 R Axis:   113  Text Interpretation: Sinus rhythm Probable right ventricular hypertrophy Minimal ST elevation, lateral leads No significant change since last tracing Confirmed by Remona Boom [phone]) on 06/07/2023 11:19:41 AM  Radiology DG Chest 2 View Result Date: 06/07/2023 CLINICAL DATA:  Shortness of breath. EXAM: CHEST - 2 VIEW COMPARISON:  03/23/2022. FINDINGS: Bilateral lungs appear hyperexpanded and hyperlucent with coarse bronchovascular markings, in keeping with COPD. There are superimposed new heterogeneous opacities in the left lower lobe, compatible with pneumonia. Follow-up to clearing is recommended. Bilateral lungs otherwise appear clear. Bilateral lung fields are clear. There is probable trace left pleural effusion. No significant right pleural effusion. No  pneumothorax on either side. Normal cardio-mediastinal silhouette. No acute osseous abnormalities.  Midthoracic kyphoplasties noted. The soft tissues are within normal limits. IMPRESSION: *Left lung lower lobe pneumonia. Follow-up to clearing is recommended. *Probable trace left pleural effusion. *Underlying COPD. Electronically Signed   By: Beula Brunswick M.D.   On: 06/07/2023 13:19    Procedures Procedures    Medications Ordered in ED Medications  albuterol  (VENTOLIN  HFA) 108 (90 Base) MCG/ACT inhaler 2 puff (2 puffs Inhalation Given 06/07/23 1315)  cefTRIAXone  (ROCEPHIN ) 1  g in sodium chloride  0.9 % 100 mL IVPB (has no administration in time range)  azithromycin  (ZITHROMAX ) 500 mg in sodium chloride  0.9 % 250 mL IVPB (has no administration in time range)  ipratropium-albuterol  (DUONEB) 0.5-2.5 (3) MG/3ML nebulizer solution 3 mL (3 mLs Nebulization Given 06/07/23 1130)  methylPREDNISolone  sodium succinate (SOLU-MEDROL ) 125 mg/2 mL injection 125 mg (125 mg Intravenous Given 06/07/23 1125)  acetaminophen  (TYLENOL ) tablet 1,000 mg (1,000 mg Oral Given 06/07/23 1244)    ED Course/ Medical Decision Making/ A&P                                 Medical Decision Making Amount and/or Complexity of Data Reviewed Labs: ordered. Radiology: ordered.  Risk OTC drugs. Prescription drug management. Decision regarding hospitalization.   CRITICAL CARE Performed by: Champagne Paletta Total critical care time: 40 minutes Critical care time was exclusive of separately billable procedures and treating other patients. Critical care was necessary to treat or prevent imminent or life-threatening deterioration. Critical care was time spent personally by me on the following activities: development of treatment plan with patient and/or surrogate as well as nursing, discussions with consultants, evaluation of patient's response to treatment, examination of patient, obtaining history from patient or surrogate,  ordering and performing treatments and interventions, ordering and review of laboratory studies, ordering and review of radiographic studies, pulse oximetry and re-evaluation of patient's condition.  Patient with any wheezing here but very poor air movement bilaterally.  New oxygen requirement.  Will treat with DuoNeb give Solu-Medrol  chest x-ray is pending respiratory panel and labs are pending.  Patient's troponin x 2 were 7 and 6 no significant abnormalities white blood cell count is 34.9.  Hemoglobin 15.4 platelets of 430.  Complete metabolic panel sodium 133 CO2 21 glucose 115.  Renal functions normal at greater than 60.  Respiratory panel negative.  Chest x-ray consistent with left lower lobe pneumonia.  Also there is some exacerbation of COPD.  Patient did have improvement in his movement of air with the Solu-Medrol  and the nebulizer treatments.  Will start Rocephin  and Zithromax  contact hospitalist for admission.  Final Clinical Impression(s) / ED Diagnoses Final diagnoses:  Hypoxia  Community acquired pneumonia of left lower lobe of lung  COPD exacerbation (HCC)    Rx / DC Orders ED Discharge Orders     None         Nicklas Barns, MD 06/07/23 1119    Nicklas Barns, MD 06/07/23 1350

## 2023-06-07 NOTE — ED Triage Notes (Signed)
 Pt arrived via POV from home c/o worsening SOB. Pt reports he does not use Oxygen at home. Pt reports his O2 Sats at home were in the 70's. Pt reports he has been using his inhaler w/o relief. Pt reports Hx of COPD. Pts O2 Sats 87% in Triage. Pt placed on 2L Nasal Cannula.

## 2023-06-07 NOTE — ED Notes (Signed)
 Pt states he is comfortable at this time. Wife at bedside. Call light within reach and bed locked and in lowest position.

## 2023-06-07 NOTE — Hospital Course (Signed)
 73 y/o current active longtime smoker with COPD, on daily prednisone , no oxygen dependence, GERD, chronic dyspnea, rheumatoid arthritis, hypertension, history of pneumonia, status post kyphoplasty, presented to ED by POV from home complaining of worsening shortness of breath over the past several days associated with nonproductive cough chest congestion and wheezing.  Patient reports that he is easily fatigued with walking short distances.  He has been using his home bronchodilators without relief in symptoms.  He was noted to have low oxygen saturation at home in the 70s.  He was noted to be 87% on room air in triage.  He was placed on supplemental oxygen with some improvement in symptoms.  His workup in the ED was positive for left lower lobe pneumonia and COPD exacerbation.  He had a leukocytosis with a WBC of 34.9 and his respiratory panel was negative.  SARS 2 coronavirus testing negative.  His oxygen requirement has increased to 4 L nasal cannula.  He is being admitted for acute respiratory failure with hypoxia secondary to community-acquired pneumonia and COPD exacerbation.

## 2023-06-07 NOTE — ED Notes (Addendum)
 Pt stated "I am having a splitting headache. I need something." This nurse notified EDP. Offered to lower lights and slightly close blinds to help accomodate pt. Pt declined.

## 2023-06-07 NOTE — ED Notes (Signed)
 Pt states he feels somewhat better but still short of breath. Albuterol  inhaler 2 puffs administered.

## 2023-06-07 NOTE — ED Notes (Signed)
 Pt denies needs at this time. Wife at bedside. Call light within reach and bed locked and in lowest position.

## 2023-06-07 NOTE — H&P (Signed)
 History and Physical  [facility]  Robert Pugh GNF:[id number] DOB: May 02, 1951 DOA: 06/07/2023  PCP: Gentry Kief, FNP  Patient coming from: Home by POV Level of care: Med-Surg  I have personally briefly reviewed patient's old medical records in [facility]  Chief Complaint: SOB  HPI: Robert Pugh is a 73 y/o current active longtime smoker with COPD, on daily prednisone , no oxygen dependence, GERD, chronic dyspnea, rheumatoid arthritis, hypertension, history of pneumonia, status post kyphoplasty, presented to ED by POV from home complaining of worsening shortness of breath over the past several days associated with nonproductive cough chest congestion and wheezing.  Patient reports that he is easily fatigued with walking short distances.  He has been using his home bronchodilators without relief in symptoms.  He was noted to have low oxygen saturation at home in the 70s.  He was noted to be 87% on room air in triage.  He was placed on supplemental oxygen with some improvement in symptoms.  His workup in the ED was positive for left lower lobe pneumonia and COPD exacerbation.  He had a leukocytosis with a WBC of 34.9 and his respiratory panel was negative.  SARS 2 coronavirus testing negative.  His oxygen requirement has increased to 4 L nasal cannula.  He is being admitted for acute respiratory failure with hypoxia secondary to community-acquired pneumonia and COPD exacerbation.  Past Medical History:  Diagnosis Date   Arthritis    Bowel obstruction (HCC)    Chronic headaches    COPD (chronic obstructive pulmonary disease) (HCC)    Dyspnea    GERD (gastroesophageal reflux disease)    High blood pressure    Kidney stone    Pneumonia     Past Surgical History:  Procedure Laterality Date   APPENDECTOMY  09/2016   BACK SURGERY     HERNIA REPAIR Right 2006   KNEE SURGERY Left    KYPHOPLASTY  09/2019   KYPHOPLASTY N/A 04/28/2022   Procedure: T7 KYPHOPLASTY;  Surgeon: Audie Bleacher, MD;  Location: [facility] OR;  Service: Neurosurgery;  Laterality: N/A;  RM 18 to follow 3C   KYPHOPLASTY N/A 06/16/2022   Procedure: KYPHOPLASTY COMPRESSION FRACTURE THORACIC EIGHT;  Surgeon: Audie Bleacher, MD;  Location: [facility] OR;  Service: Neurosurgery;  Laterality: N/A;  RM 21   Left shoulder repair      LUMBAR LAMINECTOMY/DECOMPRESSION MICRODISCECTOMY Right 12/06/2019   Procedure: Right Lumbar Three-Four Far Lateral Discectomy;  Surgeon: Audie Bleacher, MD;  Location: [facility] OR;  Service: Neurosurgery;  Laterality: Right;   plural infusion  1991   ROTATOR CUFF REPAIR Right      reports that he has been smoking cigarettes. He has a 20 pack-year smoking history. He has never used smokeless tobacco. He reports that he does not currently use alcohol. He reports that he does not use drugs.  Allergies  Allergen Reactions   Aspirin     stomach upset   Gabapentin      Arms and legs jerking, vision changes   Family History  Problem Relation Age of Onset   Diabetes Mother    Multiple sclerosis Mother    Heart disease Father    Congestive Heart Failure Father    Esophageal cancer Neg Hx    Colon cancer Neg Hx    Pancreatic cancer Neg Hx    Prostate cancer Neg Hx     Prior to Admission medications   Medication Sig Start Date End Date Taking? Authorizing Provider  albuterol  (PROAIR  HFA) 108 (90  Base) MCG/ACT inhaler 2 puffs every 4 hours as needed only  if your can't catch your breath 08/17/22  Yes Diamond Formica, MD  cloNIDine  (CATAPRES ) 0.1 MG tablet Take 1 tablet (0.1 mg total) by mouth 2 (two) times daily. 01/26/18  Yes Diamond Formica, MD  Glycopyrrolate -Formoterol  (BEVESPI  AEROSPHERE) 9-4.8 MCG/ACT AERO Take 2 puffs first thing in am and then another 2 puffs about 12 hours later. 02/02/23  Yes Diamond Formica, MD  naproxen sodium (ALEVE) 220 MG tablet Take 220 mg by mouth daily as needed (pain).   Yes [provider]  predniSONE  (DELTASONE ) 5 MG tablet Take 5 mg by mouth daily with  breakfast.   Yes [provider]  verapamil  (CALAN -SR) 120 MG CR tablet Take 120 mg by mouth daily. 04/13/22  Yes [provider]    Physical Exam: Vitals:   06/07/23 1345 06/07/23 1400 06/07/23 1415 06/07/23 1430  BP: 124/72 122/73 120/75 120/76  Pulse: 90 90 90 94  Resp: (!) 25 (!) 27 17 19   Temp:      TempSrc:      SpO2: 92% (!) 89% 95% 93%  Weight:      Height:        Constitutional: NAD, calm, comfortable on 4L/min nasal cannula  Eyes: PERRL, lids and conjunctivae normal ENMT: Mucous membranes are moist. Posterior pharynx clear of any exudate or lesions.Normal dentition.  Neck: normal, supple, no masses, no thyromegaly Respiratory: diffuse rales RLL, moderate increased work of breathing.  Cardiovascular: normal s1, s2 sounds, no murmurs / rubs / gallops. No extremity edema. 2+ pedal pulses. No carotid bruits.  Abdomen: no tenderness, no masses palpated. No hepatosplenomegaly. Bowel sounds positive.  Musculoskeletal: no clubbing / cyanosis. No joint deformity upper and lower extremities. Good ROM, no contractures. Normal muscle tone.  Skin: no rashes, lesions, ulcers. No induration Neurologic: CN 2-12 grossly intact. Sensation intact, DTR normal. Strength 5/5 in all 4.  Psychiatric: Normal judgment and insight. Alert and oriented x 3. Normal mood.   Labs on Admission: I have personally reviewed following labs and imaging studies  CBC: Recent Labs  Lab 06/07/23 1040  WBC 34.9*  NEUTROABS 30.8*  HGB 15.4  HCT 48.1  MCV 66.4*  PLT 430*   Basic Metabolic Panel: Recent Labs  Lab 06/07/23 1040  NA 133*  K 3.8  CL 102  CO2 21*  GLUCOSE 115*  BUN 15  CREATININE 0.69  CALCIUM 8.7*   GFR: Estimated Creatinine Clearance: 86.3 mL/min (by C-G formula based on SCr of 0.69 mg/dL). Liver Function Tests: Recent Labs  Lab 06/07/23 1040  AST 17  ALT 15  ALKPHOS 87  BILITOT 1.2  PROT 6.5  ALBUMIN 3.1*   No results for input(s): "LIPASE", "AMYLASE"  in the last 168 hours. No results for input(s): "AMMONIA" in the last 168 hours. Coagulation Profile: No results for input(s): "INR", "PROTIME" in the last 168 hours. Cardiac Enzymes: No results for input(s): "CKTOTAL", "CKMB", "CKMBINDEX", "TROPONINI" in the last 168 hours. BNP (last 3 results) No results for input(s): "PROBNP" in the last 8760 hours. HbA1C: No results for input(s): "HGBA1C" in the last 72 hours. CBG: No results for input(s): "GLUCAP" in the last 168 hours. Lipid Profile: No results for input(s): "CHOL", "HDL", "LDLCALC", "TRIG", "CHOLHDL", "LDLDIRECT" in the last 72 hours. Thyroid Function Tests: No results for input(s): "TSH", "T4TOTAL", "FREET4", "T3FREE", "THYROIDAB" in the last 72 hours. Anemia Panel: No results for input(s): "VITAMINB12", "FOLATE", "FERRITIN", "TIBC", "IRON", "RETICCTPCT" in  the last 72 hours. Urine analysis:    Component Value Date/Time   COLORURINE YELLOW 02/06/2018 1423   APPEARANCEUR CLEAR 02/06/2018 1423   LABSPEC 1.025 02/06/2018 1423   PHURINE 6.0 02/06/2018 1423   GLUCOSEU 100 (A) 02/06/2018 1423   HGBUR NEGATIVE 02/06/2018 1423   BILIRUBINUR NEGATIVE 02/06/2018 1423   KETONESUR NEGATIVE 02/06/2018 1423   UROBILINOGEN 0.2 02/06/2018 1423   NITRITE NEGATIVE 02/06/2018 1423   LEUKOCYTESUR NEGATIVE 02/06/2018 1423    Radiological Exams on Admission: DG Chest 2 View Result Date: 06/07/2023 CLINICAL DATA:  Shortness of breath. EXAM: CHEST - 2 VIEW COMPARISON:  03/23/2022. FINDINGS: Bilateral lungs appear hyperexpanded and hyperlucent with coarse bronchovascular markings, in keeping with COPD. There are superimposed new heterogeneous opacities in the left lower lobe, compatible with pneumonia. Follow-up to clearing is recommended. Bilateral lungs otherwise appear clear. Bilateral lung fields are clear. There is probable trace left pleural effusion. No significant right pleural effusion. No pneumothorax on either side. Normal  cardio-mediastinal silhouette. No acute osseous abnormalities.  Midthoracic kyphoplasties noted. The soft tissues are within normal limits. IMPRESSION: *Left lung lower lobe pneumonia. Follow-up to clearing is recommended. *Probable trace left pleural effusion. *Underlying COPD. Electronically Signed   By: Beula Brunswick M.D.   On: 06/07/2023 13:19    EKG: Independently reviewed.  CXR:   Assessment/Plan Principal Problem:   CAP (community acquired pneumonia) Active Problems:   Leukocytosis   Dyspnea on exertion   COPD  GOLD III   Cigarette smoker   Upper airway cough syndrome   Essential hypertension   RA (rheumatoid arthritis) (HCC)   HNP (herniated nucleus pulposus), lumbar   Community Acquired Pneumonia  - Admit for antibiotics  - received CTX/Azith in ED  - start levofloxacin  750 mg daily on 1/16  - supplemental oxygen as ordered wean as able  - continue mucolytics  - bronchodilators   COPD exacerbation  - stop home oral prednisone  while on IV steroids - solumedrol 40 mg IV daily  - bronchodilators as ordered - mucolytics as ordered  - antibiotics as ordered   Acute respiratory failure with hypoxia  - continue supplemental oxygen as ordered - treating as above  - wean oxygen as able   Essential hypertension  - resume home medication   Rheumatoid arthritis  - reports recent worsening of symptoms - IV solumedrol 40 mg daily ordered  Tobacco use - ordered nicotine  patch daily  - counseling at bedside on tobacco cessation    DVT prophylaxis: enoxaparin    Code Status: full   Family Communication: wife at bedside   Disposition Plan: home   Consults called:   Admission status: INP   Level of care: Med-Surg Faustino Hook MD [facility] How to contact the [facility] Attending or Consulting provider 7A - 7P or covering provider during after hours 7P -7A, for this patient?  Check the care team in [facility] and look for a) attending/consulting [facility] provider listed and  b) the [facility] team listed Log into [email or web address] and use [facility] universal password to access. If you do not have the password, please contact the hospital operator. Locate the [facility] provider you are looking for under [facility] and page to a number that you can be directly reached. If you still have difficulty reaching the provider, please page the [facility] (Director on Call) for the Hospitalists listed on amion for assistance.   If 7PM-7AM, please contact night-coverage [email or web address] Password [facility]  06/07/2023, 2:41 PM

## 2023-06-08 DIAGNOSIS — R0609 Other forms of dyspnea: Secondary | ICD-10-CM

## 2023-06-08 DIAGNOSIS — M069 Rheumatoid arthritis, unspecified: Secondary | ICD-10-CM | POA: Diagnosis not present

## 2023-06-08 DIAGNOSIS — D72829 Elevated white blood cell count, unspecified: Secondary | ICD-10-CM | POA: Diagnosis not present

## 2023-06-08 DIAGNOSIS — J189 Pneumonia, unspecified organism: Secondary | ICD-10-CM | POA: Diagnosis not present

## 2023-06-08 LAB — CBC WITH DIFFERENTIAL/PLATELET
Abs Immature Granulocytes: 0.43 10*3/uL — ABNORMAL HIGH (ref 0.00–0.07)
Basophils Absolute: 0.1 10*3/uL (ref 0.0–0.1)
Basophils Relative: 0 %
Eosinophils Absolute: 0 10*3/uL (ref 0.0–0.5)
Eosinophils Relative: 0 %
HCT: 45 % (ref 39.0–52.0)
Hemoglobin: 14.2 g/dL (ref 13.0–17.0)
Immature Granulocytes: 1 %
Lymphocytes Relative: 1 %
Lymphs Abs: 0.5 10*3/uL — ABNORMAL LOW (ref 0.7–4.0)
MCH: 21 pg — ABNORMAL LOW (ref 26.0–34.0)
MCHC: 31.6 g/dL (ref 30.0–36.0)
MCV: 66.7 fL — ABNORMAL LOW (ref 80.0–100.0)
Monocytes Absolute: 1.9 10*3/uL — ABNORMAL HIGH (ref 0.1–1.0)
Monocytes Relative: 5 %
Neutro Abs: 36.2 10*3/uL — ABNORMAL HIGH (ref 1.7–7.7)
Neutrophils Relative %: 93 %
Platelets: 408 10*3/uL — ABNORMAL HIGH (ref 150–400)
RBC: 6.75 MIL/uL — ABNORMAL HIGH (ref 4.22–5.81)
RDW: 17.5 % — ABNORMAL HIGH (ref 11.5–15.5)
WBC: 39.2 10*3/uL — ABNORMAL HIGH (ref 4.0–10.5)
nRBC: 0 % (ref 0.0–0.2)

## 2023-06-08 LAB — BASIC METABOLIC PANEL
Anion gap: 9 (ref 5–15)
BUN: 17 mg/dL (ref 8–23)
CO2: 20 mmol/L — ABNORMAL LOW (ref 22–32)
Calcium: 8.7 mg/dL — ABNORMAL LOW (ref 8.9–10.3)
Chloride: 104 mmol/L (ref 98–111)
Creatinine, Ser: 0.61 mg/dL (ref 0.61–1.24)
GFR, Estimated: >60 mL/min (ref >60)
Glucose, Bld: 178 mg/dL — ABNORMAL HIGH (ref 70–99)
Potassium: 4.1 mmol/L (ref 3.5–5.1)
Sodium: 133 mmol/L — ABNORMAL LOW (ref 135–145)

## 2023-06-08 MED ORDER — SIMETHICONE 80 MG PO CHEW
80.0000 mg | CHEWABLE_TABLET | Freq: Four times a day (QID) | ORAL | Status: DC | PRN
  Administered 2023-06-08: 80 mg via ORAL
  Filled 2023-06-08: qty 1

## 2023-06-08 MED ORDER — ORAL CARE MOUTH RINSE: 15.0000 mL | OROMUCOSAL | Status: DC | PRN

## 2023-06-08 NOTE — Plan of Care (Signed)

## 2023-06-08 NOTE — Progress Notes (Signed)
Mobility Specialist Progress Note:    06/08/23 1029  Mobility  Activity Ambulated with assistance in hallway  Level of Assistance Standby assist, set-up cues, supervision of patient - no hands on  Assistive Device None  Distance Ambulated (ft) 100 ft  Range of Motion/Exercises Active;All extremities  Activity Response Tolerated well  Mobility Referral Yes  Mobility visit 1 Mobility  Mobility Specialist Start Time (ACUTE ONLY) 0955  Mobility Specialist Stop Time (ACUTE ONLY) 1015  Mobility Specialist Time Calculation (min) (ACUTE ONLY) 20 min   Pt received in bed, wife at bedside. Agreeable to mobility, required supervision to stand and ambulate with no AD. Tolerated well, SpO2 varied between 88-92% on 4L throughout session. Pt denies SOB. Returned to room, left sitting EOB. All needs met.   Lawerance Bach Mobility Specialist Please contact via Special educational needs teacher or  Rehab office at [phone]

## 2023-06-08 NOTE — Progress Notes (Addendum)
PROGRESS NOTE   Camp Kreger  MVH:[id number] DOB: December 12, 1950 DOA: 06/07/2023 PCP: Daryel Gerald, FNP   Chief Complaint  Patient presents with   Shortness of Breath   Level of care: Med-Surg  Brief Admission History:  73 y/o current active longtime smoker with COPD, on daily prednisone, no oxygen dependence, GERD, chronic dyspnea, rheumatoid arthritis, hypertension, history of pneumonia, status post kyphoplasty, presented to ED by POV from home complaining of worsening shortness of breath over the past several days associated with nonproductive cough chest congestion and wheezing.  Patient reports that he is easily fatigued with walking short distances.  He has been using his home bronchodilators without relief in symptoms.  He was noted to have low oxygen saturation at home in the 70s.  He was noted to be 87% on room air in triage.  He was placed on supplemental oxygen with some improvement in symptoms.  His workup in the ED was positive for left lower lobe pneumonia and COPD exacerbation.  He had a leukocytosis with a WBC of 34.9 and his respiratory panel was negative.  SARS 2 coronavirus testing negative.  His oxygen requirement has increased to 4 L nasal cannula.  He is being admitted for acute respiratory failure with hypoxia secondary to community-acquired pneumonia and COPD exacerbation.   Assessment and Plan:  Community Acquired Pneumonia  - Admit for antibiotics  - received CTX/Azith in ED  - start levofloxacin 750 mg daily on 1/16  - supplemental oxygen as ordered wean as able  - continue mucolytics  - bronchodilators  - anticipate DC home 1/17 if continue to improve   Leukocytosis - possible leukemoid reaction - dohle bodies seen - will refer to hematology at DC to follow up after completing infection treatment   COPD exacerbation  - stop home oral prednisone while on IV steroids - solumedrol 40 mg IV daily  - bronchodilators as ordered - mucolytics as ordered  -  antibiotics as ordered    Acute respiratory failure with hypoxia  - continue supplemental oxygen as ordered - treating as above  - wean oxygen as able he is still on 3L/min at this time - home O2 eval screen prior to DC    Essential hypertension  - resume home medication    Rheumatoid arthritis  - reports recent worsening of symptoms - IV solumedrol 40 mg daily ordered   Tobacco use - ordered nicotine patch daily  - counseling at bedside on tobacco cessation   DVT prophylaxis: enoxaparin  Code Status: full  Family Communication: wife 1/15  Disposition: anticipate home    Consultants:   Procedures:   Antimicrobials:  CTX/AZI 1/15 LEVOFLOX 1/16>>   Subjective: Pt reports that he has been breathing a little bit better over last few hours and having productive cough.  Pt denies fever and chills.   Objective: Vitals:   06/08/23 0810 06/08/23 0810 06/08/23 0814 06/08/23 1218  BP:  122/70  129/75  Pulse:  78  83  Resp:  16  18  Temp:    98.1 F (36.7 C)  TempSrc:    Oral  SpO2: 100% 98% 100% 92%  Weight:      Height:        Intake/Output Summary (Last 24 hours) at 06/08/2023 1556 Last data filed at 06/08/2023 0900 Gross per 24 hour  Intake 240 ml  Output --  Net 240 ml   Filed Weights   06/07/23 1031  Weight: 72 kg   Examination:  General exam: Appears  calm and comfortable  Respiratory system: Clear to auscultation. Respiratory effort normal. Cardiovascular system: normal S1 & S2 heard. No JVD, murmurs, rubs, gallops or clicks. No pedal edema. Gastrointestinal system: Abdomen is nondistended, soft and nontender. No organomegaly or masses felt. Normal bowel sounds heard. Central nervous system: Alert and oriented. No focal neurological deficits. Extremities: Symmetric 5 x 5 power. Skin: No rashes, lesions or ulcers. Psychiatry: Judgement and insight appear normal. Mood & affect appropriate.   Data Reviewed: I have personally reviewed following labs and  imaging studies  CBC: Recent Labs  Lab 06/07/23 1040 06/08/23 0401  WBC 34.9* 39.2*  NEUTROABS 30.8* 36.2*  HGB 15.4 14.2  HCT 48.1 45.0  MCV 66.4* 66.7*  PLT 430* 408*    Basic Metabolic Panel: Recent Labs  Lab 06/07/23 1040 06/08/23 0401  NA 133* 133*  K 3.8 4.1  CL 102 104  CO2 21* 20*  GLUCOSE 115* 178*  BUN 15 17  CREATININE 0.69 0.61  CALCIUM 8.7* 8.7*    CBG: No results for input(s): "GLUCAP" in the last 168 hours.  Recent Results (from the past 240 hours)  Resp panel by RT-PCR (RSV, Flu A&B, Covid) Anterior Nasal Swab     Status: None   Collection Time: 06/07/23 10:26 AM   Specimen: Anterior Nasal Swab  Result Value Ref Range Status   SARS Coronavirus 2 by RT PCR NEGATIVE NEGATIVE Final    Comment: (NOTE) SARS-CoV-2 target nucleic acids are NOT DETECTED.  The SARS-CoV-2 RNA is generally detectable in upper respiratory specimens during the acute phase of infection. The lowest concentration of SARS-CoV-2 viral copies this assay can detect is 138 copies/mL. A negative result does not preclude SARS-Cov-2 infection and should not be used as the sole basis for treatment or other patient management decisions. A negative result may occur with  improper specimen collection/handling, submission of specimen other than nasopharyngeal swab, presence of viral mutation(s) within the areas targeted by this assay, and inadequate number of viral copies(<138 copies/mL). A negative result must be combined with clinical observations, patient history, and epidemiological information. The expected result is Negative.  Fact Sheet for Patients:  BloggerCourse.com  Fact Sheet for Healthcare Providers:  SeriousBroker.it  This test is no t yet approved or cleared by the [place] FDA and  has been authorized for detection and/or diagnosis of SARS-CoV-2 by FDA under an Emergency Use Authorization (EUA). This EUA will  remain  in effect (meaning this test can be used) for the duration of the COVID-19 declaration under Section 564(b)(1) of the Act, 21 U.S.C.section 360bbb-3(b)(1), unless the authorization is terminated  or revoked sooner.       Influenza A by PCR NEGATIVE NEGATIVE Final   Influenza B by PCR NEGATIVE NEGATIVE Final    Comment: (NOTE) The Xpert Xpress SARS-CoV-2/FLU/RSV plus assay is intended as an aid in the diagnosis of influenza from Nasopharyngeal swab specimens and should not be used as a sole basis for treatment. Nasal washings and aspirates are unacceptable for Xpert Xpress SARS-CoV-2/FLU/RSV testing.  Fact Sheet for Patients: BloggerCourse.com  Fact Sheet for Healthcare Providers: SeriousBroker.it  This test is not yet approved or cleared by the [place] FDA and has been authorized for detection and/or diagnosis of SARS-CoV-2 by FDA under an Emergency Use Authorization (EUA). This EUA will remain in effect (meaning this test can be used) for the duration of the COVID-19 declaration under Section 564(b)(1) of the Act, 21 U.S.C. section 360bbb-3(b)(1), unless the authorization is terminated or  revoked.     Resp Syncytial Virus by PCR NEGATIVE NEGATIVE Final    Comment: (NOTE) Fact Sheet for Patients: BloggerCourse.com  Fact Sheet for Healthcare Providers: SeriousBroker.it  This test is not yet approved or cleared by the [place] FDA and has been authorized for detection and/or diagnosis of SARS-CoV-2 by FDA under an Emergency Use Authorization (EUA). This EUA will remain in effect (meaning this test can be used) for the duration of the COVID-19 declaration under Section 564(b)(1) of the Act, 21 U.S.C. section 360bbb-3(b)(1), unless the authorization is terminated or revoked.  Performed at [facility], [address], [place], [place] [address]       Radiology Studies: DG Chest 2 View Result Date: 06/07/2023 CLINICAL DATA:  Shortness of breath. EXAM: CHEST - 2 VIEW COMPARISON:  03/23/2022. FINDINGS: Bilateral lungs appear hyperexpanded and hyperlucent with coarse bronchovascular markings, in keeping with COPD. There are superimposed new heterogeneous opacities in the left lower lobe, compatible with pneumonia. Follow-up to clearing is recommended. Bilateral lungs otherwise appear clear. Bilateral lung fields are clear. There is probable trace left pleural effusion. No significant right pleural effusion. No pneumothorax on either side. Normal cardio-mediastinal silhouette. No acute osseous abnormalities.  Midthoracic kyphoplasties noted. The soft tissues are within normal limits. IMPRESSION: *Left lung lower lobe pneumonia. Follow-up to clearing is recommended. *Probable trace left pleural effusion. *Underlying COPD. Electronically Signed   By: Jules Schick M.D.   On: 06/07/2023 13:19    Scheduled Meds:  albuterol  2.5 mg Nebulization Q6H   arformoterol  15 mcg Nebulization BID   And   umeclidinium bromide  1 puff Inhalation Daily   cloNIDine  0.1 mg Oral BID   enoxaparin (LOVENOX) injection  40 mg Subcutaneous Q24H   guaiFENesin  1,200 mg Oral BID   levofloxacin  750 mg Oral Daily   methylPREDNISolone (SOLU-MEDROL) injection  40 mg Intravenous Daily   nicotine  21 mg Transdermal Daily   pantoprazole  40 mg Oral Q supper   verapamil  120 mg Oral Daily   Continuous Infusions:   LOS: 1 day   Time spent: 47 mins  Murphy Duzan Laural Benes, MD How to contact the [facility] Attending or Consulting provider 7A - 7P or covering provider during after hours 7P -7A, for this patient?  Check the care team in [facility] and look for a) attending/consulting [facility] provider listed and b) the [facility] team listed Log into [email or web address] to find provider on call.  Locate the [facility] provider you are looking for under [facility] and page to a number that you can be  directly reached. If you still have difficulty reaching the provider, please page the [facility] (Director on Call) for the Hospitalists listed on amion for assistance.  06/08/2023, 3:56 PM

## 2023-06-08 NOTE — Progress Notes (Signed)
   06/08/23 1000  TOC Brief Assessment  Insurance and Status Reviewed  Patient has primary care physician Yes  Home environment has been reviewed from home  Prior level of function: independent  Prior/Current Home Services No current home services  Social Drivers of Health Review SDOH reviewed no interventions necessary  Readmission risk has been reviewed Yes  Transition of care needs no transition of care needs at this time    Transition of Care Department [facility]) has reviewed patient and no TOC needs have been identified at this time. We will continue to monitor patient advancement through interdisciplinary progression rounds. If new patient transition needs arise, please place a TOC consult.

## 2023-06-08 NOTE — ED Notes (Signed)
ED TO INPATIENT HANDOFF REPORT  ED Nurse Name and Phone #: [phone] Pilar Jarvis Name/Age/Gender Robert Pugh 73 y.o. male Room/Bed: APA02/APA02  Code Status   Code Status: Full Code  Home/SNF/Other Home Patient oriented to: self, place, time, and situation Is this baseline? Yes   Triage Complete: Triage complete  Chief Complaint CAP (community acquired pneumonia) [J18.9]  Triage Note Pt arrived via POV from home c/o worsening SOB. Pt reports he does not use Oxygen at home. Pt reports his O2 Sats at home were in the 70's. Pt reports he has been using his inhaler w/o relief. Pt reports Hx of COPD. Pts O2 Sats 87% in Triage. Pt placed on 2L Nasal Cannula.    Allergies Allergies  Allergen Reactions   Aspirin     stomach upset   Gabapentin     Arms and legs jerking, vision changes    Level of Care/Admitting Diagnosis ED Disposition     ED Disposition  Admit   Condition  --   Comment  Hospital Area: [facility] [[id number]]  Level of Care: Med-Surg [[id number]]  Covid Evaluation: Asymptomatic - no recent exposure (last 10 days) testing not required  Diagnosis: CAP (community acquired pneumonia) [725366]  Admitting Physician: Cleora Fleet [[id number]]  Attending Physician: Cleora Fleet [[id number]]  Certification:: I certify this patient will need inpatient services for at least 2 midnights  Expected Medical Readiness: 06/10/2023          B Medical/Surgery History Past Medical History:  Diagnosis Date   Arthritis    Bowel obstruction (HCC)    Chronic headaches    COPD (chronic obstructive pulmonary disease) (HCC)    Dyspnea    GERD (gastroesophageal reflux disease)    High blood pressure    Kidney stone    Pneumonia    Past Surgical History:  Procedure Laterality Date   APPENDECTOMY  09/2016   BACK SURGERY     HERNIA REPAIR Right 2006   KNEE SURGERY Left    KYPHOPLASTY  09/2019   KYPHOPLASTY N/A 04/28/2022   Procedure: T7 KYPHOPLASTY;  Surgeon: Coletta Memos, MD;  Location: [facility] OR;  Service: Neurosurgery;  Laterality: N/A;  RM 18 to follow 3C   KYPHOPLASTY N/A 06/16/2022   Procedure: KYPHOPLASTY COMPRESSION FRACTURE THORACIC EIGHT;  Surgeon: Coletta Memos, MD;  Location: [facility] OR;  Service: Neurosurgery;  Laterality: N/A;  RM 21   Left shoulder repair      LUMBAR LAMINECTOMY/DECOMPRESSION MICRODISCECTOMY Right 12/06/2019   Procedure: Right Lumbar Three-Four Far Lateral Discectomy;  Surgeon: Coletta Memos, MD;  Location: [facility] OR;  Service: Neurosurgery;  Laterality: Right;   plural infusion  1991   ROTATOR CUFF REPAIR Right      A IV Location/Drains/Wounds Patient Lines/Drains/Airways Status     Active Line/Drains/Airways     Name Placement date Placement time Site Days   Peripheral IV 06/07/23 20 G Anterior;Right Forearm 06/07/23  1100  Forearm  1            Intake/Output Last 24 hours  Intake/Output Summary (Last 24 hours) at 06/08/2023 0719 Last data filed at 06/07/2023 1507 Gross per 24 hour  Intake 349.13 ml  Output 200 ml  Net 149.13 ml    Labs/Imaging Results for orders placed or performed during the hospital encounter of 06/07/23 (from the past 48 hours)  Resp panel by RT-PCR (RSV, Flu A&B, Covid) Anterior Nasal Swab     Status: None   Collection Time: 06/07/23 10:26 AM  Specimen: Anterior Nasal Swab  Result Value Ref Range   SARS Coronavirus 2 by RT PCR NEGATIVE NEGATIVE    Comment: (NOTE) SARS-CoV-2 target nucleic acids are NOT DETECTED.  The SARS-CoV-2 RNA is generally detectable in upper respiratory specimens during the acute phase of infection. The lowest concentration of SARS-CoV-2 viral copies this assay can detect is 138 copies/mL. A negative result does not preclude SARS-Cov-2 infection and should not be used as the sole basis for treatment or other patient management decisions. A negative result may occur with  improper specimen collection/handling, submission of specimen other than nasopharyngeal swab,  presence of viral mutation(s) within the areas targeted by this assay, and inadequate number of viral copies(<138 copies/mL). A negative result must be combined with clinical observations, patient history, and epidemiological information. The expected result is Negative.  Fact Sheet for Patients:  BloggerCourse.com  Fact Sheet for Healthcare Providers:  SeriousBroker.it  This test is no t yet approved or cleared by the [place] FDA and  has been authorized for detection and/or diagnosis of SARS-CoV-2 by FDA under an Emergency Use Authorization (EUA). This EUA will remain  in effect (meaning this test can be used) for the duration of the COVID-19 declaration under Section 564(b)(1) of the Act, 21 U.S.C.section 360bbb-3(b)(1), unless the authorization is terminated  or revoked sooner.       Influenza A by PCR NEGATIVE NEGATIVE   Influenza B by PCR NEGATIVE NEGATIVE    Comment: (NOTE) The Xpert Xpress SARS-CoV-2/FLU/RSV plus assay is intended as an aid in the diagnosis of influenza from Nasopharyngeal swab specimens and should not be used as a sole basis for treatment. Nasal washings and aspirates are unacceptable for Xpert Xpress SARS-CoV-2/FLU/RSV testing.  Fact Sheet for Patients: BloggerCourse.com  Fact Sheet for Healthcare Providers: SeriousBroker.it  This test is not yet approved or cleared by the [place] FDA and has been authorized for detection and/or diagnosis of SARS-CoV-2 by FDA under an Emergency Use Authorization (EUA). This EUA will remain in effect (meaning this test can be used) for the duration of the COVID-19 declaration under Section 564(b)(1) of the Act, 21 U.S.C. section 360bbb-3(b)(1), unless the authorization is terminated or revoked.     Resp Syncytial Virus by PCR NEGATIVE NEGATIVE    Comment: (NOTE) Fact Sheet for  Patients: BloggerCourse.com  Fact Sheet for Healthcare Providers: SeriousBroker.it  This test is not yet approved or cleared by the [place] FDA and has been authorized for detection and/or diagnosis of SARS-CoV-2 by FDA under an Emergency Use Authorization (EUA). This EUA will remain in effect (meaning this test can be used) for the duration of the COVID-19 declaration under Section 564(b)(1) of the Act, 21 U.S.C. section 360bbb-3(b)(1), unless the authorization is terminated or revoked.  Performed at [facility], [address], [place], [place] [address]   Troponin I (High Sensitivity)     Status: None   Collection Time: 06/07/23 10:40 AM  Result Value Ref Range   Troponin I (High Sensitivity) 7 <18 ng/L    Comment: (NOTE) Elevated high sensitivity troponin I (hsTnI) values and significant  changes across serial measurements may suggest ACS but many other  chronic and acute conditions are known to elevate hsTnI results.  Refer to the "Links" section for chest pain algorithms and additional  guidance. Performed at [facility], [address], [place], [place] [address]   CBC with Differential     Status: Abnormal   Collection Time: 06/07/23 10:40 AM  Result Value  Ref Range   WBC 34.9 (H) 4.0 - 10.5 K/uL   RBC 7.24 (H) 4.22 - 5.81 MIL/uL   Hemoglobin 15.4 13.0 - 17.0 g/dL   HCT 11.9 14.7 - 82.9 %   MCV 66.4 (L) 80.0 - 100.0 fL   MCH 21.3 (L) 26.0 - 34.0 pg   MCHC 32.0 30.0 - 36.0 g/dL   RDW 56.2 (H) 13.0 - 86.5 %   Platelets 430 (H) 150 - 400 K/uL   nRBC 0.0 0.0 - 0.2 %   Neutrophils Relative % 88 %   Neutro Abs 30.8 (H) 1.7 - 7.7 K/uL   Lymphocytes Relative 3 %   Lymphs Abs 0.9 0.7 - 4.0 K/uL   Monocytes Relative 8 %   Monocytes Absolute 2.6 (H) 0.1 - 1.0 K/uL   Eosinophils Relative 0 %   Eosinophils Absolute 0.0 0.0 - 0.5 K/uL   Basophils Relative 0 %   Basophils Absolute 0.1 0.0 - 0.1 K/uL   WBC Morphology  See Note     Comment: LEUKOCYTOSIS   Smear Review MORPHOLOGY UNREMARKABLE    Immature Granulocytes 1 %   Abs Immature Granulocytes 0.39 (H) 0.00 - 0.07 K/uL   Tear Drop Cells PRESENT     Comment: Performed at [facility], [address], [place], [place] [address]  Comprehensive metabolic panel     Status: Abnormal   Collection Time: 06/07/23 10:40 AM  Result Value Ref Range   Sodium 133 (L) 135 - 145 mmol/L   Potassium 3.8 3.5 - 5.1 mmol/L   Chloride 102 98 - 111 mmol/L   CO2 21 (L) 22 - 32 mmol/L   Glucose, Bld 115 (H) 70 - 99 mg/dL    Comment: Glucose reference range applies only to samples taken after fasting for at least 8 hours.   BUN 15 8 - 23 mg/dL   Creatinine, Ser 6.29 0.61 - 1.24 mg/dL   Calcium 8.7 (L) 8.9 - 10.3 mg/dL   Total Protein 6.5 6.5 - 8.1 g/dL   Albumin 3.1 (L) 3.5 - 5.0 g/dL   AST 17 15 - 41 U/L   ALT 15 0 - 44 U/L   Alkaline Phosphatase 87 38 - 126 U/L   Total Bilirubin 1.2 0.0 - 1.2 mg/dL   GFR, Estimated >52 >84 mL/min    Comment: (NOTE) Calculated using the CKD-EPI Creatinine Equation (2021)    Anion gap 10 5 - 15    Comment: Performed at [facility], [address], [place], [place] [address]  Troponin I (High Sensitivity)     Status: None   Collection Time: 06/07/23 12:40 PM  Result Value Ref Range   Troponin I (High Sensitivity) 6 <18 ng/L    Comment: (NOTE) Elevated high sensitivity troponin I (hsTnI) values and significant  changes across serial measurements may suggest ACS but many other  chronic and acute conditions are known to elevate hsTnI results.  Refer to the "Links" section for chest pain algorithms and additional  guidance. Performed at [facility], [address], [place], [place] [address]   Basic metabolic panel     Status: Abnormal   Collection Time: 06/08/23  4:01 AM  Result Value Ref Range   Sodium 133 (L) 135 - 145 mmol/L   Potassium 4.1 3.5 - 5.1 mmol/L   Chloride 104 98 - 111 mmol/L   CO2 20 (L) 22 - 32 mmol/L    Glucose, Bld 178 (H) 70 - 99 mg/dL    Comment: Glucose reference range  applies only to samples taken after fasting for at least 8 hours.   BUN 17 8 - 23 mg/dL   Creatinine, Ser 4.09 0.61 - 1.24 mg/dL   Calcium 8.7 (L) 8.9 - 10.3 mg/dL   GFR, Estimated >81 >19 mL/min    Comment: (NOTE) Calculated using the CKD-EPI Creatinine Equation (2021)    Anion gap 9 5 - 15    Comment: Performed at [facility], [address], [place], [place] [address]  CBC with Differential/Platelet     Status: Abnormal   Collection Time: 06/08/23  4:01 AM  Result Value Ref Range   WBC 39.2 (H) 4.0 - 10.5 K/uL    Comment: REPEATED TO VERIFY   RBC 6.75 (H) 4.22 - 5.81 MIL/uL   Hemoglobin 14.2 13.0 - 17.0 g/dL   HCT 95.6 21.3 - 08.6 %   MCV 66.7 (L) 80.0 - 100.0 fL   MCH 21.0 (L) 26.0 - 34.0 pg   MCHC 31.6 30.0 - 36.0 g/dL   RDW 57.8 (H) 46.9 - 62.9 %   Platelets 408 (H) 150 - 400 K/uL   nRBC 0.0 0.0 - 0.2 %   Neutrophils Relative % 93 %   Neutro Abs 36.2 (H) 1.7 - 7.7 K/uL   Lymphocytes Relative 1 %   Lymphs Abs 0.5 (L) 0.7 - 4.0 K/uL   Monocytes Relative 5 %   Monocytes Absolute 1.9 (H) 0.1 - 1.0 K/uL   Eosinophils Relative 0 %   Eosinophils Absolute 0.0 0.0 - 0.5 K/uL   Basophils Relative 0 %   Basophils Absolute 0.1 0.0 - 0.1 K/uL   WBC Morphology DOHLE BODIES     Comment: See Note LEUKOCYTOSIS    RBC Morphology See Note     Comment: MICROCYTOSIS PRESENT   Smear Review MORPHOLOGY UNREMARKABLE    Immature Granulocytes 1 %   Abs Immature Granulocytes 0.43 (H) 0.00 - 0.07 K/uL   Polychromasia PRESENT     Comment: Performed at [facility], [address], [place], [place] [address]   DG Chest 2 View Result Date: 06/07/2023 CLINICAL DATA:  Shortness of breath. EXAM: CHEST - 2 VIEW COMPARISON:  03/23/2022. FINDINGS: Bilateral lungs appear hyperexpanded and hyperlucent with coarse bronchovascular markings, in keeping with COPD. There are superimposed new heterogeneous opacities in the left lower lobe,  compatible with pneumonia. Follow-up to clearing is recommended. Bilateral lungs otherwise appear clear. Bilateral lung fields are clear. There is probable trace left pleural effusion. No significant right pleural effusion. No pneumothorax on either side. Normal cardio-mediastinal silhouette. No acute osseous abnormalities.  Midthoracic kyphoplasties noted. The soft tissues are within normal limits. IMPRESSION: *Left lung lower lobe pneumonia. Follow-up to clearing is recommended. *Probable trace left pleural effusion. *Underlying COPD. Electronically Signed   By: Jules Schick M.D.   On: 06/07/2023 13:19    Pending Labs Unresulted Labs (From admission, onward)     Start     Ordered   06/14/23 0500  Creatinine, serum  (enoxaparin (LOVENOX)    CrCl >/= 30 ml/min)  Weekly,   R     Comments: while on enoxaparin therapy    06/07/23 1440   06/08/23 0500  CBC with Differential/Platelet  Daily,   R      06/07/23 1440            Vitals/Pain Today's Vitals   06/08/23 0600 06/08/23 0633 06/08/23 0700 06/08/23 0718  BP: 94/66  101/65   Pulse: 73  74   Resp: (!) 22  (!)  22   Temp:      TempSrc:      SpO2: 92%  91%   Weight:      Height:      PainSc:  4   0-No pain    Isolation Precautions No active isolations  Medications Medications  levofloxacin (LEVAQUIN) tablet 750 mg (has no administration in time range)  cloNIDine (CATAPRES) tablet 0.1 mg (0.1 mg Oral Given 06/07/23 2110)  verapamil (CALAN-SR) CR tablet 120 mg (120 mg Oral Given 06/07/23 2110)  enoxaparin (LOVENOX) injection 40 mg (40 mg Subcutaneous Given 06/07/23 1701)  acetaminophen (TYLENOL) tablet 650 mg (has no administration in time range)    Or  acetaminophen (TYLENOL) suppository 650 mg (has no administration in time range)  oxyCODONE (Oxy IR/ROXICODONE) immediate release tablet 5 mg (5 mg Oral Given 06/08/23 0528)  fentaNYL (SUBLIMAZE) injection 12.5 mcg (has no administration in time range)  ondansetron (ZOFRAN)  tablet 4 mg (has no administration in time range)    Or  ondansetron (ZOFRAN) injection 4 mg (has no administration in time range)  guaiFENesin (MUCINEX) 12 hr tablet 1,200 mg (1,200 mg Oral Given 06/07/23 2110)  dextromethorphan (DELSYM) 30 MG/5ML liquid 30 mg (has no administration in time range)  methylPREDNISolone sodium succinate (SOLU-MEDROL) 40 mg/mL injection 40 mg (has no administration in time range)  pantoprazole (PROTONIX) EC tablet 40 mg (40 mg Oral Given 06/07/23 1701)  nicotine (NICODERM CQ - dosed in mg/24 hours) patch 21 mg (21 mg Transdermal Patch Applied 06/07/23 1538)  arformoterol (BROVANA) nebulizer solution 15 mcg (has no administration in time range)    And  umeclidinium bromide (INCRUSE ELLIPTA) 62.5 MCG/ACT 1 puff (has no administration in time range)  albuterol (PROVENTIL) (2.5 MG/3ML) 0.083% nebulizer solution 2.5 mg (2.5 mg Nebulization Given 06/08/23 0150)  albuterol (PROVENTIL) (2.5 MG/3ML) 0.083% nebulizer solution 2.5 mg (has no administration in time range)  ipratropium-albuterol (DUONEB) 0.5-2.5 (3) MG/3ML nebulizer solution 3 mL (3 mLs Nebulization Given 06/07/23 1130)  methylPREDNISolone sodium succinate (SOLU-MEDROL) 125 mg/2 mL injection 125 mg (125 mg Intravenous Given 06/07/23 1125)  acetaminophen (TYLENOL) tablet 1,000 mg (1,000 mg Oral Given 06/07/23 1244)  cefTRIAXone (ROCEPHIN) 1 g in sodium chloride 0.9 % 100 mL IVPB (0 g Intravenous Stopped 06/07/23 1425)  azithromycin (ZITHROMAX) 500 mg in sodium chloride 0.9 % 250 mL IVPB (0 mg Intravenous Stopped 06/07/23 1507)    Mobility walks     Focused Assessments Pulmonary Assessment Handoff:  Lung sounds: Bilateral Breath Sounds: Diminished O2 Device: Nasal Cannula O2 Flow Rate (L/min): 4 L/min    R Recommendations: See Admitting Provider Note  Report given to:   Additional Notes:  A&O x 3 , uses urinal, able to feed self , takes prn pain meds,pleasant.

## 2023-06-09 DIAGNOSIS — M069 Rheumatoid arthritis, unspecified: Secondary | ICD-10-CM | POA: Diagnosis not present

## 2023-06-09 DIAGNOSIS — J189 Pneumonia, unspecified organism: Secondary | ICD-10-CM | POA: Diagnosis not present

## 2023-06-09 DIAGNOSIS — F17210 Nicotine dependence, cigarettes, uncomplicated: Secondary | ICD-10-CM | POA: Diagnosis not present

## 2023-06-09 DIAGNOSIS — D72829 Elevated white blood cell count, unspecified: Secondary | ICD-10-CM | POA: Diagnosis not present

## 2023-06-09 LAB — CBC WITH DIFFERENTIAL/PLATELET
Abs Immature Granulocytes: 0.55 10*3/uL — ABNORMAL HIGH (ref 0.00–0.07)
Basophils Absolute: 0.1 10*3/uL (ref 0.0–0.1)
Basophils Relative: 0 %
Eosinophils Absolute: 0 10*3/uL (ref 0.0–0.5)
Eosinophils Relative: 0 %
HCT: 40.1 % (ref 39.0–52.0)
Hemoglobin: 12.7 g/dL — ABNORMAL LOW (ref 13.0–17.0)
Immature Granulocytes: 2 %
Lymphocytes Relative: 2 %
Lymphs Abs: 0.8 10*3/uL (ref 0.7–4.0)
MCH: 21.1 pg — ABNORMAL LOW (ref 26.0–34.0)
MCHC: 31.7 g/dL (ref 30.0–36.0)
MCV: 66.7 fL — ABNORMAL LOW (ref 80.0–100.0)
Monocytes Absolute: 1.7 10*3/uL — ABNORMAL HIGH (ref 0.1–1.0)
Monocytes Relative: 5 %
Neutro Abs: 31.1 10*3/uL — ABNORMAL HIGH (ref 1.7–7.7)
Neutrophils Relative %: 91 %
Platelets: 451 10*3/uL — ABNORMAL HIGH (ref 150–400)
RBC: 6.01 MIL/uL — ABNORMAL HIGH (ref 4.22–5.81)
RDW: 17.2 % — ABNORMAL HIGH (ref 11.5–15.5)
Smear Review: INCREASED
WBC: 34.2 10*3/uL — ABNORMAL HIGH (ref 4.0–10.5)
nRBC: 0 % (ref 0.0–0.2)

## 2023-06-09 MED ORDER — PREDNISONE 20 MG PO TABS: 40.0000 mg | ORAL_TABLET | Freq: Every day | ORAL | 0 refills | Status: AC

## 2023-06-09 MED ORDER — ALBUTEROL SULFATE (2.5 MG/3ML) 0.083% IN NEBU: 2.5000 mg | INHALATION_SOLUTION | RESPIRATORY_TRACT | 3 refills | Status: DC | PRN

## 2023-06-09 MED ORDER — PREDNISONE 5 MG PO TABS: 5.0000 mg | ORAL_TABLET | Freq: Every day | ORAL | Status: DC

## 2023-06-09 MED ORDER — SACCHAROMYCES BOULARDII 250 MG PO CAPS: 250.0000 mg | ORAL_CAPSULE | Freq: Two times a day (BID) | ORAL | 0 refills | Status: AC

## 2023-06-09 MED ORDER — LEVOFLOXACIN 750 MG PO TABS: 750.0000 mg | ORAL_TABLET | Freq: Every day | ORAL | 0 refills | Status: AC

## 2023-06-09 NOTE — Discharge Instructions (Addendum)
PLEASE HAVE CHEST XRAY REPEATED IN 4-6 WEEKS TO ENSURE FULL RESOLUTION OF PNEUMONIA INFECTION.     IMPORTANT INFORMATION: PAY CLOSE ATTENTION   PHYSICIAN DISCHARGE INSTRUCTIONS  Follow with Primary care provider  Daryel Gerald, FNP  and other consultants as instructed by your Hospitalist Physician  SEEK MEDICAL CARE OR RETURN TO EMERGENCY ROOM IF SYMPTOMS COME BACK, WORSEN OR NEW PROBLEM DEVELOPS   Please note: You were cared for by a hospitalist during your hospital stay. Every effort will be made to forward records to your primary care provider.  You can request that your primary care provider send for your hospital records if they have not received them.  Once you are discharged, your primary care physician will handle any further medical issues. Please note that NO REFILLS for any discharge medications will be authorized once you are discharged, as it is imperative that you return to your primary care physician (or establish a relationship with a primary care physician if you do not have one) for your post hospital discharge needs so that they can reassess your need for medications and monitor your lab values.  Please get a complete blood count and chemistry panel checked by your Primary MD at your next visit, and again as instructed by your Primary MD.  Get Medicines reviewed and adjusted: Please take all your medications with you for your next visit with your Primary MD  Laboratory/radiological data: Please request your Primary MD to go over all hospital tests and procedure/radiological results at the follow up, please ask your primary care provider to get all Hospital records sent to his/her office.  In some cases, they will be blood work, cultures and biopsy results pending at the time of your discharge. Please request that your primary care provider follow up on these results.  If you are diabetic, please bring your blood sugar readings with you to your follow up appointment with  primary care.    Please call and make your follow up appointments as soon as possible.    Also Note the following: If you experience worsening of your admission symptoms, develop shortness of breath, life threatening emergency, suicidal or homicidal thoughts you must seek medical attention immediately by calling 911 or calling your MD immediately  if symptoms less severe.  You must read complete instructions/literature along with all the possible adverse reactions/side effects for all the Medicines you take and that have been prescribed to you. Take any new Medicines after you have completely understood and accpet all the possible adverse reactions/side effects.   Do not drive when taking Pain medications or sleeping medications (Benzodiazepines)  Do not take more than prescribed Pain, Sleep and Anxiety Medications. It is not advisable to combine anxiety,sleep and pain medications without talking with your primary care practitioner  Special Instructions: If you have smoked or chewed Tobacco  in the last 2 yrs please stop smoking, stop any regular Alcohol  and or any Recreational drug use.  Wear Seat belts while driving.  Do not drive if taking any narcotic, mind altering or controlled substances or recreational drugs or alcohol.

## 2023-06-09 NOTE — TOC Transition Note (Signed)
Transition of Care [facility]) - Discharge Note   Patient Details  Name: Robert Pugh MRN: [id number] Date of Birth: 05-Aug-1950  Transition of [facility]) CM/SW Contact:  Leitha Bleak, RN Phone Number: 06/09/2023, 12:16 PM   Clinical Narrative:   Patient discharging home. CM at the bedside to discuss oxygen and neb machine order. Patient had told staff he had oxygen.  Patient has a concentrator, no oxygen and no neb machine. CM gave him companies and options. He is agreeable to [employer]. Referral sent to [facility]. Note and order completed. Driver will set up home deliver and deliver neb machine.     Final next level of care: Home/Self Care Barriers to Discharge: Barriers Resolved  Patient Goals and CMS Choice Patient states their goals for this hospitalization and ongoing recovery are:: to go home. CMS Medicare.gov Compare Post Acute Care list provided to:: Patient Choice offered to / list presented to : Patient     Discharge Placement              Patient and family notified of of transfer: 06/09/23  Discharge Plan and Services Additional resources added to the After Visit Summary for                 DME Arranged: Oxygen, Nebulizer machine DME Agency: Patsy Lager Date DME Agency Contacted: 06/09/23 Time DME Agency Contacted: 1215 Representative spoke with at DME Agency: Kathie Rhodes            Social Drivers of Health (SDOH) Interventions SDOH Screenings   Food Insecurity: No Food Insecurity (06/07/2023)  Housing: Low Risk  (06/07/2023)  Transportation Needs: No Transportation Needs (06/07/2023)  Utilities: Not At Risk (06/07/2023)  Social Connections: Moderately Integrated (06/08/2023)  Tobacco Use: High Risk (06/07/2023)    Readmission Risk Interventions    06/09/2023   12:13 PM  Readmission Risk Prevention Plan  Post Dischage Appt Complete  Medication Screening Complete  Transportation Screening Complete

## 2023-06-09 NOTE — Progress Notes (Addendum)
SATURATION QUALIFICATIONS: (This note is used to comply with regulatory documentation for home oxygen)  Patient Saturations on Room Air at Rest = 86%  Patient Saturations on Room Air while Ambulating = 84%  Patient Saturations on 2L Liters of oxygen while Ambulating = 91%  Please briefly explain why patient needs home oxygen: Patient oxygen was below 88% without oxygen on room air it was 86% , when ambulating it went down to 84% , after placing on 2L he came up to 91%

## 2023-06-09 NOTE — Care Management Important Message (Signed)
Important Message  Patient Details  Name: Robert Pugh MRN: [id number] Date of Birth: 28-Feb-1951   Important Message Given:  N/A - LOS <3 / Initial given by admissions     Corey Harold 06/09/2023, 11:22 AM

## 2023-06-09 NOTE — Discharge Summary (Signed)
Physician Discharge Summary  Robert Pugh QMV:[id number] DOB: [birth year] DOA: 06/07/2023  PCP: Daryel Gerald, FNP  Admit date: 06/07/2023 Discharge date: 06/09/2023  Admitted From:  HOME  Disposition: HOME   Recommendations for Outpatient Follow-up:  Follow up with PCP in 1 weeks  Discharge Condition: STABLE   CODE STATUS: FULL DIET: resume previous home diet    Brief Hospitalization Summary: Please see all hospital notes, images, labs for full details of the hospitalization. Admission provider HPI:  73 y/o current active longtime smoker with COPD, on daily prednisone, no oxygen dependence, GERD, chronic dyspnea, rheumatoid arthritis, hypertension, history of pneumonia, status post kyphoplasty, presented to ED by POV from home complaining of worsening shortness of breath over the past several days associated with nonproductive cough chest congestion and wheezing.  Patient reports that he is easily fatigued with walking short distances.  He has been using his home bronchodilators without relief in symptoms.  He was noted to have low oxygen saturation at home in the 70s.  He was noted to be 87% on room air in triage.  He was placed on supplemental oxygen with some improvement in symptoms.  His workup in the ED was positive for left lower lobe pneumonia and COPD exacerbation.  He had a leukocytosis with a WBC of 34.9 and his respiratory panel was negative.  SARS 2 coronavirus testing negative.  His oxygen requirement has increased to 4 L nasal cannula.  He is being admitted for acute respiratory failure with hypoxia secondary to community-acquired pneumonia and COPD exacerbation.  HOSPITAL COURSE BY PROBLEM LIST  Community Acquired Pneumonia  - Admitted for antibiotics  - received CTX/Azith in ED  - started levofloxacin 750 mg daily on 1/16  - supplemental oxygen as ordered, remains on 2.5 L/m [place]  - treated with mucolytics  - bronchodilators prescribed at DC  - DC home 1/17 to complete oral  levofloxacin at home  - pt says he has home oxygen and nebulizer at home    Leukocytosis - possible leukemoid reaction - dohle bodies seen - will refer to hematology at DC to follow up after completing infection treatment - recommend recheck CBC in 2 weeks after completed treatment for pneumonia   COPD exacerbation  - solumedrol 40 mg IV daily in hospital, DC on oral prednisone x 4 days then resume daily regular prednisone 5 mg  - bronchodilators as ordered - mucolytics as ordered  - antibiotics as ordered    Acute respiratory failure with hypoxia  - continue supplemental oxygen as ordered - treating as above  - home O2 eval screen prior to DC  - pt adamant he has home oxygen and nebulizer already at home   Essential hypertension  - resume home medication    Rheumatoid arthritis  - reports recent worsening of symptoms - IV solumedrol 40 mg daily ordered in hospital - pt to resume home oral prednisone   Tobacco use - ordered nicotine patch daily  - counseling at bedside on tobacco cessation    Discharge Diagnoses:  Principal Problem:   CAP (community acquired pneumonia) Active Problems:   Leukocytosis   Dyspnea on exertion   COPD  GOLD III   Cigarette smoker   Upper airway cough syndrome   Essential hypertension   RA (rheumatoid arthritis) (HCC)   HNP (herniated nucleus pulposus), lumbar   Discharge Instructions: Discharge Instructions     Ambulatory referral to Hematology / Oncology   Complete by: As directed       Allergies as  of 06/09/2023       Reactions   Aspirin    stomach upset   Gabapentin    Arms and legs jerking, vision changes        Medication List     STOP taking these medications    naproxen sodium 220 MG tablet Commonly known as: ALEVE       TAKE these medications    albuterol 108 (90 Base) MCG/ACT inhaler Commonly known as: ProAir HFA 2 puffs every 4 hours as needed only  if your can't catch your breath What changed:  Another medication with the same name was added. Make sure you understand how and when to take each.   albuterol (2.5 MG/3ML) 0.083% nebulizer solution Commonly known as: PROVENTIL Take 3 mLs (2.5 mg total) by nebulization every 4 (four) hours as needed for shortness of breath or wheezing. What changed: You were already taking a medication with the same name, and this prescription was added. Make sure you understand how and when to take each.   Bevespi Aerosphere 9-4.8 MCG/ACT Aero Generic drug: Glycopyrrolate-Formoterol Take 2 puffs first thing in am and then another 2 puffs about 12 hours later.   cloNIDine 0.1 MG tablet Commonly known as: CATAPRES Take 1 tablet (0.1 mg total) by mouth 2 (two) times daily.   levofloxacin 750 MG tablet Commonly known as: LEVAQUIN Take 1 tablet (750 mg total) by mouth daily for 4 days. Start taking on: June 10, 2023   predniSONE 20 MG tablet Commonly known as: DELTASONE Take 2 tablets (40 mg total) by mouth daily with breakfast for 4 days. THEN RESUME REGULAR DOSE OF 5 mg daily Start taking on: June 10, 2023 What changed: You were already taking a medication with the same name, and this prescription was added. Make sure you understand how and when to take each.   predniSONE 5 MG tablet Commonly known as: DELTASONE Take 1 tablet (5 mg total) by mouth daily with breakfast. Start taking on: June 15, 2023 What changed: These instructions start on June 15, 2023. If you are unsure what to do until then, ask your doctor or other care provider.   saccharomyces boulardii 250 MG capsule Commonly known as: Florastor Take 1 capsule (250 mg total) by mouth 2 (two) times daily for 14 days.   verapamil 120 MG CR tablet Commonly known as: CALAN-SR Take 120 mg by mouth daily.        Follow-up Information     Daryel Gerald, FNP. Schedule an appointment as soon as possible for a visit in 1 week(s).   Specialty: Family Medicine Why: Hospital  Follow Up Contact information: [address] [place] [address] [phone]         [facility]. Schedule an appointment as soon as possible for a visit in 1 month(s).   Specialty: Oncology Why: Hospital Follow Up Contact information: [address] [place] [address] [phone]        [facility]. Schedule an appointment as soon as possible for a visit in 2 week(s).   Specialty: Pulmonology Why: Hospital Follow Up Contact information: [address] [place] [address] [phone]               Allergies  Allergen Reactions   Aspirin     stomach upset   Gabapentin     Arms and legs jerking, vision changes   Allergies as of 06/09/2023  Reactions   Aspirin    stomach upset   Gabapentin    Arms and legs jerking, vision changes        Medication List     STOP taking these medications    naproxen sodium 220 MG tablet Commonly known as: ALEVE       TAKE these medications    albuterol 108 (90 Base) MCG/ACT inhaler Commonly known as: ProAir HFA 2 puffs every 4 hours as needed only  if your can't catch your breath What changed: Another medication with the same name was added. Make sure you understand how and when to take each.   albuterol (2.5 MG/3ML) 0.083% nebulizer solution Commonly known as: PROVENTIL Take 3 mLs (2.5 mg total) by nebulization every 4 (four) hours as needed for shortness of breath or wheezing. What changed: You were already taking a medication with the same name, and this prescription was added. Make sure you understand how and when to take each.   Bevespi Aerosphere 9-4.8 MCG/ACT Aero Generic drug: Glycopyrrolate-Formoterol Take 2 puffs first thing in am and then another 2 puffs about 12 hours later.   cloNIDine 0.1 MG tablet Commonly known as: CATAPRES Take 1 tablet (0.1 mg total) by mouth 2 (two) times daily.    levofloxacin 750 MG tablet Commonly known as: LEVAQUIN Take 1 tablet (750 mg total) by mouth daily for 4 days. Start taking on: June 10, 2023   predniSONE 20 MG tablet Commonly known as: DELTASONE Take 2 tablets (40 mg total) by mouth daily with breakfast for 4 days. THEN RESUME REGULAR DOSE OF 5 mg daily Start taking on: June 10, 2023 What changed: You were already taking a medication with the same name, and this prescription was added. Make sure you understand how and when to take each.   predniSONE 5 MG tablet Commonly known as: DELTASONE Take 1 tablet (5 mg total) by mouth daily with breakfast. Start taking on: June 15, 2023 What changed: These instructions start on June 15, 2023. If you are unsure what to do until then, ask your doctor or other care provider.   saccharomyces boulardii 250 MG capsule Commonly known as: Florastor Take 1 capsule (250 mg total) by mouth 2 (two) times daily for 14 days.   verapamil 120 MG CR tablet Commonly known as: CALAN-SR Take 120 mg by mouth daily.        Procedures/Studies: DG Chest 2 View Result Date: 06/07/2023 CLINICAL DATA:  Shortness of breath. EXAM: CHEST - 2 VIEW COMPARISON:  03/23/2022. FINDINGS: Bilateral lungs appear hyperexpanded and hyperlucent with coarse bronchovascular markings, in keeping with COPD. There are superimposed new heterogeneous opacities in the left lower lobe, compatible with pneumonia. Follow-up to clearing is recommended. Bilateral lungs otherwise appear clear. Bilateral lung fields are clear. There is probable trace left pleural effusion. No significant right pleural effusion. No pneumothorax on either side. Normal cardio-mediastinal silhouette. No acute osseous abnormalities.  Midthoracic kyphoplasties noted. The soft tissues are within normal limits. IMPRESSION: *Left lung lower lobe pneumonia. Follow-up to clearing is recommended. *Probable trace left pleural effusion. *Underlying COPD.  Electronically Signed   By: Jules Schick M.D.   On: 06/07/2023 13:19     Subjective: Pt says he feels much better today.  No SOB or chest pain.  He is having a productive cough with thick brown sputum.  He says he has home oxygen and nebulizer set up at home.   Discharge Exam: Vitals:   06/09/23 0544 06/09/23 0937  BP:  131/84   Pulse: 77   Resp:    Temp: 98.4 F (36.9 C)   SpO2: 92% 92%   Vitals:   06/08/23 2134 06/09/23 0215 06/09/23 0544 06/09/23 0937  BP: (!) 143/87  131/84   Pulse:   77   Resp:      Temp:   98.4 F (36.9 C)   TempSrc:   Oral   SpO2:  90% 92% 92%  Weight:      Height:        General: Pt is alert, awake, not in acute distress Cardiovascular: RRR, S1/S2 +, no rubs, no gallops Respiratory: diminished BS LLL. no wheezing, no rhonchi Abdominal: Soft, NT, ND, bowel sounds + Extremities: no edema, no cyanosis   The results of significant diagnostics from this hospitalization (including imaging, microbiology, ancillary and laboratory) are listed below for reference.     Microbiology: Recent Results (from the past 240 hours)  Resp panel by RT-PCR (RSV, Flu A&B, Covid) Anterior Nasal Swab     Status: None   Collection Time: 06/07/23 10:26 AM   Specimen: Anterior Nasal Swab  Result Value Ref Range Status   SARS Coronavirus 2 by RT PCR NEGATIVE NEGATIVE Final    Comment: (NOTE) SARS-CoV-2 target nucleic acids are NOT DETECTED.  The SARS-CoV-2 RNA is generally detectable in upper respiratory specimens during the acute phase of infection. The lowest concentration of SARS-CoV-2 viral copies this assay can detect is 138 copies/mL. A negative result does not preclude SARS-Cov-2 infection and should not be used as the sole basis for treatment or other patient management decisions. A negative result may occur with  improper specimen collection/handling, submission of specimen other than nasopharyngeal swab, presence of viral mutation(s) within the areas  targeted by this assay, and inadequate number of viral copies(<138 copies/mL). A negative result must be combined with clinical observations, patient history, and epidemiological information. The expected result is Negative.  Fact Sheet for Patients:  BloggerCourse.com  Fact Sheet for Healthcare Providers:  SeriousBroker.it  This test is no t yet approved or cleared by the [place] FDA and  has been authorized for detection and/or diagnosis of SARS-CoV-2 by FDA under an Emergency Use Authorization (EUA). This EUA will remain  in effect (meaning this test can be used) for the duration of the COVID-19 declaration under Section 564(b)(1) of the Act, 21 U.S.C.section 360bbb-3(b)(1), unless the authorization is terminated  or revoked sooner.       Influenza A by PCR NEGATIVE NEGATIVE Final   Influenza B by PCR NEGATIVE NEGATIVE Final    Comment: (NOTE) The Xpert Xpress SARS-CoV-2/FLU/RSV plus assay is intended as an aid in the diagnosis of influenza from Nasopharyngeal swab specimens and should not be used as a sole basis for treatment. Nasal washings and aspirates are unacceptable for Xpert Xpress SARS-CoV-2/FLU/RSV testing.  Fact Sheet for Patients: BloggerCourse.com  Fact Sheet for Healthcare Providers: SeriousBroker.it  This test is not yet approved or cleared by the [place] FDA and has been authorized for detection and/or diagnosis of SARS-CoV-2 by FDA under an Emergency Use Authorization (EUA). This EUA will remain in effect (meaning this test can be used) for the duration of the COVID-19 declaration under Section 564(b)(1) of the Act, 21 U.S.C. section 360bbb-3(b)(1), unless the authorization is terminated or revoked.     Resp Syncytial Virus by PCR NEGATIVE NEGATIVE Final    Comment: (NOTE) Fact Sheet for  Patients: BloggerCourse.com  Fact Sheet for Healthcare Providers: SeriousBroker.it  This test is  not yet approved or cleared by the [place] and has been authorized for detection and/or diagnosis of SARS-CoV-2 by FDA under an Emergency Use Authorization (EUA). This EUA will remain in effect (meaning this test can be used) for the duration of the COVID-19 declaration under Section 564(b)(1) of the Act, 21 U.S.C. section 360bbb-3(b)(1), unless the authorization is terminated or revoked.  Performed at [facility], [address], [place], [place] [address]      Labs: BNP (last 3 results) No results for input(s): "BNP" in the last 8760 hours. Basic Metabolic Panel: Recent Labs  Lab 06/07/23 1040 06/08/23 0401  NA 133* 133*  K 3.8 4.1  CL 102 104  CO2 21* 20*  GLUCOSE 115* 178*  BUN 15 17  CREATININE 0.69 0.61  CALCIUM 8.7* 8.7*   Liver Function Tests: Recent Labs  Lab 06/07/23 1040  AST 17  ALT 15  ALKPHOS 87  BILITOT 1.2  PROT 6.5  ALBUMIN 3.1*   No results for input(s): "LIPASE", "AMYLASE" in the last 168 hours. No results for input(s): "AMMONIA" in the last 168 hours. CBC: Recent Labs  Lab 06/07/23 1040 06/08/23 0401 06/09/23 0507  WBC 34.9* 39.2* 34.2*  NEUTROABS 30.8* 36.2* 31.1*  HGB 15.4 14.2 12.7*  HCT 48.1 45.0 40.1  MCV 66.4* 66.7* 66.7*  PLT 430* 408* 451*   Cardiac Enzymes: No results for input(s): "CKTOTAL", "CKMB", "CKMBINDEX", "TROPONINI" in the last 168 hours. BNP: Invalid input(s): "POCBNP" CBG: No results for input(s): "GLUCAP" in the last 168 hours. D-Dimer No results for input(s): "DDIMER" in the last 72 hours. Hgb A1c No results for input(s): "HGBA1C" in the last 72 hours. Lipid Profile No results for input(s): "CHOL", "HDL", "LDLCALC", "TRIG", "CHOLHDL", "LDLDIRECT" in the last 72 hours. Thyroid function studies No results for input(s): "TSH", "T4TOTAL", "T3FREE",  "THYROIDAB" in the last 72 hours.  Invalid input(s): "FREET3" Anemia work up No results for input(s): "VITAMINB12", "FOLATE", "FERRITIN", "TIBC", "IRON", "RETICCTPCT" in the last 72 hours. Urinalysis    Component Value Date/Time   COLORURINE YELLOW 02/06/2018 1423   APPEARANCEUR CLEAR 02/06/2018 1423   LABSPEC 1.025 02/06/2018 1423   PHURINE 6.0 02/06/2018 1423   GLUCOSEU 100 (A) 02/06/2018 1423   HGBUR NEGATIVE 02/06/2018 1423   BILIRUBINUR NEGATIVE 02/06/2018 1423   KETONESUR NEGATIVE 02/06/2018 1423   UROBILINOGEN 0.2 02/06/2018 1423   NITRITE NEGATIVE 02/06/2018 1423   LEUKOCYTESUR NEGATIVE 02/06/2018 1423   Sepsis Labs Recent Labs  Lab 06/07/23 1040 06/08/23 0401 06/09/23 0507  WBC 34.9* 39.2* 34.2*   Microbiology Recent Results (from the past 240 hours)  Resp panel by RT-PCR (RSV, Flu A&B, Covid) Anterior Nasal Swab     Status: None   Collection Time: 06/07/23 10:26 AM   Specimen: Anterior Nasal Swab  Result Value Ref Range Status   SARS Coronavirus 2 by RT PCR NEGATIVE NEGATIVE Final    Comment: (NOTE) SARS-CoV-2 target nucleic acids are NOT DETECTED.  The SARS-CoV-2 RNA is generally detectable in upper respiratory specimens during the acute phase of infection. The lowest concentration of SARS-CoV-2 viral copies this assay can detect is 138 copies/mL. A negative result does not preclude SARS-Cov-2 infection and should not be used as the sole basis for treatment or other patient management decisions. A negative result may occur with  improper specimen collection/handling, submission of specimen other than nasopharyngeal swab, presence of viral mutation(s) within the areas targeted by this assay, and inadequate number of viral copies(<138 copies/mL). A negative result must  be combined with clinical observations, patient history, and epidemiological information. The expected result is Negative.  Fact Sheet for Patients:   BloggerCourse.com  Fact Sheet for Healthcare Providers:  SeriousBroker.it  This test is no t yet approved or cleared by the [place] FDA and  has been authorized for detection and/or diagnosis of SARS-CoV-2 by FDA under an Emergency Use Authorization (EUA). This EUA will remain  in effect (meaning this test can be used) for the duration of the COVID-19 declaration under Section 564(b)(1) of the Act, 21 U.S.C.section 360bbb-3(b)(1), unless the authorization is terminated  or revoked sooner.       Influenza A by PCR NEGATIVE NEGATIVE Final   Influenza B by PCR NEGATIVE NEGATIVE Final    Comment: (NOTE) The Xpert Xpress SARS-CoV-2/FLU/RSV plus assay is intended as an aid in the diagnosis of influenza from Nasopharyngeal swab specimens and should not be used as a sole basis for treatment. Nasal washings and aspirates are unacceptable for Xpert Xpress SARS-CoV-2/FLU/RSV testing.  Fact Sheet for Patients: BloggerCourse.com  Fact Sheet for Healthcare Providers: SeriousBroker.it  This test is not yet approved or cleared by the [place] FDA and has been authorized for detection and/or diagnosis of SARS-CoV-2 by FDA under an Emergency Use Authorization (EUA). This EUA will remain in effect (meaning this test can be used) for the duration of the COVID-19 declaration under Section 564(b)(1) of the Act, 21 U.S.C. section 360bbb-3(b)(1), unless the authorization is terminated or revoked.     Resp Syncytial Virus by PCR NEGATIVE NEGATIVE Final    Comment: (NOTE) Fact Sheet for Patients: BloggerCourse.com  Fact Sheet for Healthcare Providers: SeriousBroker.it  This test is not yet approved or cleared by the [place] FDA and has been authorized for detection and/or diagnosis of SARS-CoV-2 by FDA under an Emergency Use  Authorization (EUA). This EUA will remain in effect (meaning this test can be used) for the duration of the COVID-19 declaration under Section 564(b)(1) of the Act, 21 U.S.C. section 360bbb-3(b)(1), unless the authorization is terminated or revoked.  Performed at [facility], [address], [place], [place] [address]    Time coordinating discharge: 40 mins  SIGNED:  Standley Dakins, MD  [facility] 06/09/2023, 10:12 AM How to contact the [facility] Attending or Consulting provider 7A - 7P or covering provider during after hours 7P -7A, for this patient?  Check the care team in [facility] and look for a) attending/consulting [facility] provider listed and b) the [facility] team listed Log into [email or web address] and use [place]'s universal password to access. If you do not have the password, please contact the hospital operator. Locate the [facility] provider you are looking for under [facility] and page to a number that you can be directly reached. If you still have difficulty reaching the provider, please page the [facility] (Director on Call) for the Hospitalists listed on amion for assistance.

## 2023-06-16 ENCOUNTER — Inpatient Hospital Stay: Payer: Medicare Other

## 2023-06-16 ENCOUNTER — Inpatient Hospital Stay: Payer: Medicare Other | Admitting: Oncology

## 2023-06-22 ENCOUNTER — Encounter: Payer: Self-pay | Admitting: Internal Medicine

## 2023-06-22 ENCOUNTER — Ambulatory Visit: Payer: Medicare Other | Admitting: Internal Medicine

## 2023-06-22 ENCOUNTER — Telehealth: Payer: Self-pay | Admitting: Internal Medicine

## 2023-06-22 VITALS — BP 121/74 | HR 84 | Ht 73.0 in | Wt 165.4 lb

## 2023-06-22 DIAGNOSIS — F17210 Nicotine dependence, cigarettes, uncomplicated: Secondary | ICD-10-CM

## 2023-06-22 DIAGNOSIS — J449 Chronic obstructive pulmonary disease, unspecified: Secondary | ICD-10-CM | POA: Diagnosis not present

## 2023-06-22 DIAGNOSIS — J9611 Chronic respiratory failure with hypoxia: Secondary | ICD-10-CM

## 2023-06-22 MED ORDER — ALBUTEROL SULFATE (2.5 MG/3ML) 0.083% IN NEBU: 2.5000 mg | INHALATION_SOLUTION | RESPIRATORY_TRACT | 3 refills | Status: DC | PRN
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
## 2023-06-22 NOTE — Telephone Encounter (Signed)
Called and spoke with patient he said that he had spoke with [facility] the portable that he is has he is currently using theirs . He was told that an new order has to be sent to them for  for him to receive portable O2 . I told the patient that we place the order and send it to [facility]. Placed order for portable 02 for patient mark as urgent and sent to [facility] in [place] ,[place].

## 2023-06-22 NOTE — Telephone Encounter (Signed)
Patient was in today to see Dr. Sherene Sires and was told an order would be sent to [place] [place], [place] for an portable O concentrator----order has not been received and it needs to be sent today ASAP per patient---patient call back---[phone]

## 2023-06-22 NOTE — Assessment & Plan Note (Signed)
4-5 min discussion re active cigarette smoking in addition to office E&M  Ask about tobacco use:   ongoing despite now on 02 Advise quitting   matter of life or breath now that he not smoke on 24h/day 02  Assess willingness:  Not committed at this point Assist in quit attempt:  Per PCP when ready Arrange follow up:   Follow up per Primary Care planned  For smoking cessation classes call [phone]

## 2023-06-22 NOTE — Assessment & Plan Note (Signed)
Active smoker/ MZ  Spirometry 06/08/2017  FEV1 1.43 (36%)  Ratio 41 with classic curvature   - 06/08/2017   stiolto sample > did not tol due to muscle cramps in neck and shoulders - 06/28/2017  After extensive coaching inhaler device  effectiveness =    90% > try dulera 200 2bid and pred 20 mg daily until better then 10 mg daily > much better 07/07/2017  - PFT's  07/07/2017  FEV1 1.70 (44 % ) ratio 42  p 5 % improvement from saba p dulera 200 prior to study with DLCO  34/37 % corrects to 92  % for alv volume   - 07/07/2017 taper off pred x 2 weeks  - alpha one screening 07/07/2017  >  MZ level 98   - 08/24/2017   try add spiriva 2 respimat each am - 10/09/17 placed on daily prednisone   - 06/27/2018    Changed to bevespi req by insurance  - 01/10/2019  After extensive coaching inhaler device,  effectiveness =    90% with smi, try again to see if insurance will cover stiolto   -  01/10/2019   Walked RA x one lap =  approx 250 ft - stopped due to sob with sats 92% @ slow pace    -  07/06/2020    breztri  rx - 02/09/2021 establish floor of 5 mg pred daily for copd/ 10 mg ceiling  - 02/02/2023  After extensive coaching inhaler device,  effectiveness =    80%> try bevespi due to nausea ? From breztri  - 02/02/2023   Walked on RA  x  2  lap(s) =  approx 300  ft  @ slow/mod pace, stopped due to legs gave out with lowest 02 sats 91%   - new start on 02  06/07/23 2lpm POC / concentrator   >> 06/22/2023  After extensive coaching inhaler device,  effectiveness =    80% hfa > continue Bevespi plus floor prednisone dose = 5 mg  with the floor needed for RA control

## 2023-06-22 NOTE — Assessment & Plan Note (Addendum)
Started on 02 at admit for CAP  06/07/23 - Sats at rest 06/22/2023 90% on 2lpm POC - 06/22/2023   Walked on 2lpm POC   x  3  lap(s) =  approx 450  ft  @ brisk pace, stopped due to sob  with lowest 02 sats 89%    Advised 2lpm at hs and at home and POC 2lpm when leave home but ok to adjust daytime as follows: Make sure you check your oxygen saturation  AT  your highest level of activity (not after you stop)   to be sure it stays over 90% and adjust  02 flow upward to maintain this level if needed but remember to turn it back to previous settings when you stop (to conserve your supply).   F/u in 4 week with cxr since still recovering from LLL CAP   Each maintenance medication was reviewed in detail including emphasizing most importantly the difference between maintenance and prns and under what circumstances the prns are to be triggered using an action plan format where appropriate.  Total time for H and P, chart review, counseling, reviewing hfa/02/pulse ox  device(s) , directly observing portions of ambulatory 02 saturation study/ and generating customized AVS unique to this office visit / same day charting = 30 min post hosp f/u ov

## 2023-06-22 NOTE — Patient Instructions (Addendum)
No change in your medications  Make sure you check your oxygen saturation  AT  your highest level of activity (not after you stop)   to be sure it stays over 90% and adjust  02 flow upward to maintain this level if needed but remember to turn it back to previous settings when you stop (to conserve your supply).    Please schedule a follow up office visit in 4 weeks, sooner if needed - cxr on return
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
## 2023-07-26 ENCOUNTER — Ambulatory Visit: Payer: Medicare Other | Admitting: Internal Medicine

## 2023-07-26 ENCOUNTER — Encounter: Payer: Self-pay | Admitting: Internal Medicine

## 2023-07-26 ENCOUNTER — Ambulatory Visit (HOSPITAL_COMMUNITY)
Admission: RE | Admit: 2023-07-26 | Discharge: 2023-07-26 | Disposition: A | Discharge status: Home / Self Care | Source: Ambulatory Visit | Attending: Internal Medicine | Admitting: Internal Medicine

## 2023-07-26 VITALS — BP 123/75 | HR 84 | Ht 73.0 in | Wt 172.4 lb

## 2023-07-26 DIAGNOSIS — J449 Chronic obstructive pulmonary disease, unspecified: Secondary | ICD-10-CM | POA: Insufficient documentation

## 2023-07-26 DIAGNOSIS — J9611 Chronic respiratory failure with hypoxia: Secondary | ICD-10-CM

## 2023-07-26 DIAGNOSIS — F17210 Nicotine dependence, cigarettes, uncomplicated: Secondary | ICD-10-CM

## 2023-07-26 NOTE — Assessment & Plan Note (Addendum)
 Counseled re importance of smoking cessation but did not meet time criteria for separate billing    F/u 6 m, sooner prn  Each maintenance medication was reviewed in detail including emphasizing most importantly the difference between maintenance and prns and under what circumstances the prns are to be triggered using an action plan format where appropriate.  Total time for H and P, chart review, counseling, reviewing hfa/02 /pulse ox  device(s) , directly observing portions of ambulatory 02 saturation study/ and generating customized AVS unique to this office visit / same day charting = 32 min

## 2023-07-26 NOTE — Patient Instructions (Addendum)
 Please remember to go to the  x-ray department  @  [facility] for your tests - we will call you with the results when they are available     Make sure you check your oxygen saturation  AT  your highest level of activity (not after you stop)   to be sure it stays over 90% and adjust  02 flow upward to maintain this level if needed but remember to turn it back to previous settings when you stop (to conserve your supply).   Please schedule a follow up visit in 6 months but call sooner if needed

## 2023-07-26 NOTE — Assessment & Plan Note (Signed)
 Active smoker/ MZ  Spirometry 06/08/2017  FEV1 1.43 (36%)  Ratio 41 with classic curvature   - 06/08/2017   stiolto sample > did not tol due to muscle cramps in neck and shoulders - 06/28/2017  After extensive coaching inhaler device  effectiveness =    90% > try dulera 200 2bid and pred 20 mg daily until better then 10 mg daily > much better 07/07/2017  - PFT's  07/07/2017  FEV1 1.70 (44 % ) ratio 42  p 5 % improvement from saba p dulera 200 prior to study with DLCO  34/37 % corrects to 92  % for alv volume   - 07/07/2017 taper off pred x 2 weeks  - alpha one screening 07/07/2017  >  MZ level 98   - 08/24/2017   try add spiriva 2 respimat each am - 10/09/17 placed on daily prednisone   - 06/27/2018    Changed to bevespi req by insurance  - 01/10/2019  After extensive coaching inhaler device,  effectiveness =    90% with smi, try again to see if insurance will cover stiolto   -  01/10/2019   Walked RA x one lap =  approx 250 ft - stopped due to sob with sats 92% @ slow pace    -  07/06/2020    breztri  rx - 02/09/2021 establish floor of 5 mg pred daily for copd/ 10 mg ceiling  - 02/02/2023  After extensive coaching inhaler device,  effectiveness =    80%> try bevespi due to nausea ? From breztri  - 02/02/2023   Walked on RA  x  2  lap(s) =  approx 300  ft  @ slow/mod pace, stopped due to legs gave out with lowest 02 sats 91%   - new start on 02  06/07/23 2lpm POC / concentrator  - 06/22/2023  After extensive coaching inhaler device,  effectiveness =    80% hfa > continue Bevespi plus floor prednisone dose = 5 mg    Group D (now reclassified as E) in terms of symptom/risk and laba/lama/ICS  therefore appropriate rx at this point >>>  bevespi plus prednison 5 mg daily as floor (for RA anyway)

## 2023-07-26 NOTE — Assessment & Plan Note (Signed)
 Started on 02 at admit for CAP  06/07/23 - Sats at rest 06/22/2023 90% on 2lpm POC - 06/22/2023   Walked on 2lpm POC   x  3  lap(s) =  approx 450  ft  @ brisk pace, stopped due to sob  with lowest 02 sats 89%    Sats 87% room air after arrival - clearly needs to be wearing 02 with activity:  Make sure you check your oxygen saturation  AT  your highest level of activity (not after you stop)   to be sure it stays over 90% and adjust  02 flow upward to maintain this level if needed but remember to turn it back to previous settings when you stop (to conserve your supply).

## 2023-09-13 ENCOUNTER — Telehealth: Payer: Self-pay | Admitting: Internal Medicine

## 2023-09-13 NOTE — Telephone Encounter (Signed)
 Spoke with patient regarding provider schedule change for 11/09/23--patient's appointment rescheduled to Tuesday 11/21/23 at 2:15 pm---patient voiced his understanding and I will mail the information to him

## 2023-11-09 ENCOUNTER — Ambulatory Visit: Admitting: Internal Medicine

## 2023-11-09 ENCOUNTER — Other Ambulatory Visit: Payer: Self-pay | Admitting: Internal Medicine

## 2023-11-21 ENCOUNTER — Ambulatory Visit: Admitting: Internal Medicine

## 2023-11-30 ENCOUNTER — Ambulatory Visit (HOSPITAL_COMMUNITY)
Admission: RE | Admit: 2023-11-30 | Discharge: 2023-11-30 | Disposition: A | Discharge status: Home / Self Care | Source: Ambulatory Visit | Attending: Family Medicine | Admitting: Family Medicine

## 2023-12-01 ENCOUNTER — Other Ambulatory Visit: Payer: Self-pay

## 2023-12-01 DIAGNOSIS — Z122 Encounter for screening for malignant neoplasm of respiratory organs: Secondary | ICD-10-CM

## 2023-12-01 DIAGNOSIS — Z87891 Personal history of nicotine dependence: Secondary | ICD-10-CM

## 2023-12-01 DIAGNOSIS — F17210 Nicotine dependence, cigarettes, uncomplicated: Secondary | ICD-10-CM

## 2023-12-05 ENCOUNTER — Encounter (HOSPITAL_COMMUNITY): Payer: Self-pay | Admitting: Radiology

## 2023-12-05 ENCOUNTER — Other Ambulatory Visit: Payer: Self-pay

## 2023-12-05 ENCOUNTER — Emergency Department (HOSPITAL_COMMUNITY): Admission: EM | Admit: 2023-12-05 | Discharge: 2023-12-05 | Disposition: A | Discharge status: Home / Self Care

## 2023-12-05 ENCOUNTER — Emergency Department (HOSPITAL_COMMUNITY)

## 2023-12-05 ENCOUNTER — Ambulatory Visit (HOSPITAL_COMMUNITY)
Admission: RE | Admit: 2023-12-05 | Discharge: 2023-12-05 | Discharge status: Home / Self Care | Source: Ambulatory Visit | Attending: Acute Care | Admitting: Acute Care

## 2023-12-05 DIAGNOSIS — I700 Atherosclerosis of aorta: Secondary | ICD-10-CM | POA: Diagnosis not present

## 2023-12-05 DIAGNOSIS — F17210 Nicotine dependence, cigarettes, uncomplicated: Secondary | ICD-10-CM

## 2023-12-05 DIAGNOSIS — R1032 Left lower quadrant pain: Secondary | ICD-10-CM

## 2023-12-05 DIAGNOSIS — Z7951 Long term (current) use of inhaled steroids: Secondary | ICD-10-CM | POA: Insufficient documentation

## 2023-12-05 DIAGNOSIS — Z79899 Other long term (current) drug therapy: Secondary | ICD-10-CM | POA: Insufficient documentation

## 2023-12-05 DIAGNOSIS — Z87891 Personal history of nicotine dependence: Secondary | ICD-10-CM | POA: Diagnosis present

## 2023-12-05 DIAGNOSIS — Z122 Encounter for screening for malignant neoplasm of respiratory organs: Secondary | ICD-10-CM

## 2023-12-05 DIAGNOSIS — R911 Solitary pulmonary nodule: Secondary | ICD-10-CM | POA: Diagnosis not present

## 2023-12-05 DIAGNOSIS — J441 Chronic obstructive pulmonary disease with (acute) exacerbation: Secondary | ICD-10-CM | POA: Diagnosis not present

## 2023-12-05 DIAGNOSIS — X58XXXA Exposure to other specified factors, initial encounter: Secondary | ICD-10-CM | POA: Diagnosis not present

## 2023-12-05 DIAGNOSIS — S39011A Strain of muscle, fascia and tendon of abdomen, initial encounter: Secondary | ICD-10-CM | POA: Diagnosis not present

## 2023-12-05 DIAGNOSIS — D72829 Elevated white blood cell count, unspecified: Secondary | ICD-10-CM | POA: Insufficient documentation

## 2023-12-05 DIAGNOSIS — S3991XA Unspecified injury of abdomen, initial encounter: Secondary | ICD-10-CM | POA: Diagnosis present

## 2023-12-05 DIAGNOSIS — T148XXA Other injury of unspecified body region, initial encounter: Secondary | ICD-10-CM

## 2023-12-05 LAB — URINALYSIS, ROUTINE W REFLEX MICROSCOPIC
Bilirubin Urine: NEGATIVE
Glucose, UA: NEGATIVE mg/dL
Hgb urine dipstick: NEGATIVE
Ketones, ur: NEGATIVE mg/dL
Leukocytes,Ua: NEGATIVE
Nitrite: NEGATIVE
Protein, ur: NEGATIVE mg/dL
Specific Gravity, Urine: 1.021 (ref 1.005–1.030)
pH: 7 (ref 5.0–8.0)

## 2023-12-05 LAB — COMPREHENSIVE METABOLIC PANEL WITH GFR
ALT: 16 U/L (ref 0–44)
AST: 15 U/L (ref 15–41)
Albumin: 3.8 g/dL (ref 3.5–5.0)
Alkaline Phosphatase: 69 U/L (ref 38–126)
Anion gap: 11 (ref 5–15)
BUN: 11 mg/dL (ref 8–23)
CO2: 26 mmol/L (ref 22–32)
Calcium: 8.7 mg/dL — ABNORMAL LOW (ref 8.9–10.3)
Chloride: 99 mmol/L (ref 98–111)
Creatinine, Ser: 0.66 mg/dL (ref 0.61–1.24)
GFR, Estimated: >60 mL/min (ref >60)
Glucose, Bld: 97 mg/dL (ref 70–99)
Potassium: 4.5 mmol/L (ref 3.5–5.1)
Sodium: 136 mmol/L (ref 135–145)
Total Bilirubin: 0.8 mg/dL (ref 0.0–1.2)
Total Protein: 6.5 g/dL (ref 6.5–8.1)

## 2023-12-05 LAB — CBC WITH DIFFERENTIAL/PLATELET
Abs Immature Granulocytes: 0.14 K/uL — ABNORMAL HIGH (ref 0.00–0.07)
Basophils Absolute: 0.1 K/uL (ref 0.0–0.1)
Basophils Relative: 1 %
Eosinophils Absolute: 0.2 K/uL (ref 0.0–0.5)
Eosinophils Relative: 2 %
HCT: 46 % (ref 39.0–52.0)
Hemoglobin: 14.1 g/dL (ref 13.0–17.0)
Immature Granulocytes: 1 %
Lymphocytes Relative: 16 %
Lymphs Abs: 2.2 K/uL (ref 0.7–4.0)
MCH: 21 pg — ABNORMAL LOW (ref 26.0–34.0)
MCHC: 30.7 g/dL (ref 30.0–36.0)
MCV: 68.6 fL — ABNORMAL LOW (ref 80.0–100.0)
Monocytes Absolute: 1 K/uL (ref 0.1–1.0)
Monocytes Relative: 7 %
Neutro Abs: 10.5 K/uL — ABNORMAL HIGH (ref 1.7–7.7)
Neutrophils Relative %: 73 %
Platelets: 485 K/uL — ABNORMAL HIGH (ref 150–400)
RBC: 6.71 MIL/uL — ABNORMAL HIGH (ref 4.22–5.81)
RDW: 18.1 % — ABNORMAL HIGH (ref 11.5–15.5)
WBC: 14.1 K/uL — ABNORMAL HIGH (ref 4.0–10.5)
nRBC: 0 % (ref 0.0–0.2)

## 2023-12-05 LAB — TROPONIN I (HIGH SENSITIVITY)
Troponin I (High Sensitivity): 6 ng/L (ref <18)
Troponin I (High Sensitivity): 4 ng/L (ref <18)

## 2023-12-05 MED ORDER — MORPHINE SULFATE (PF) 4 MG/ML IV SOLN
2.0000 mg | Freq: Once | INTRAVENOUS | Status: AC
  Administered 2023-12-05: 2 mg via INTRAVENOUS
  Filled 2023-12-05: qty 1

## 2023-12-05 MED ORDER — OXYCODONE HCL 5 MG PO TABS
5.0000 mg | ORAL_TABLET | Freq: Once | ORAL | Status: AC
  Administered 2023-12-05: 5 mg via ORAL
  Filled 2023-12-05: qty 1

## 2023-12-05 MED ORDER — IOHEXOL 350 MG/ML SOLN
100.0000 mL | Freq: Once | INTRAVENOUS | Status: AC | PRN
  Administered 2023-12-05: 100 mL via INTRAVENOUS

## 2023-12-05 MED ORDER — LIDOCAINE 5 % EX PTCH
1.0000 | MEDICATED_PATCH | CUTANEOUS | Status: DC
  Administered 2023-12-05: 1 via TRANSDERMAL
  Filled 2023-12-05: qty 1

## 2023-12-05 MED ORDER — LIDOCAINE 5 % EX PTCH: 1.0000 | MEDICATED_PATCH | CUTANEOUS | 0 refills | Status: DC

## 2023-12-05 NOTE — Discharge Instructions (Addendum)
 Your CT imaging did not show any cute pathology.  There is no evidence of ileus obstruction.  If you start have any kind of worsening abdominal pain especially with lack of bowel movements or if you start have any kind of fever or chills then please come back to ED for further evaluation.   Your cardiac workup was unremarkable.   It is very possible this could be scar tissue.  You can try lidocaine  patches to the area.   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

## 2023-12-05 NOTE — ED Triage Notes (Signed)
 Pt reports he had a CT Scan of his chest earlier today but has not heard the results yet.

## 2023-12-05 NOTE — ED Triage Notes (Signed)
 Pt arrived via [facility] c/o worsening SOB X 3 days and reports severe left side abdominal pain. Pt presents on 3L Nasal Cannula O2 at baseline. Pt also endorses feeling nauseated. Pt reports last normal BM was this morning.

## 2023-12-05 NOTE — ED Provider Notes (Signed)
 [place] EMERGENCY DEPARTMENT AT [facility] Provider Note   CSN: [id number] Arrival date & time: 12/05/23  [id number]     Patient presents with: Shortness of Breath   Robert Pugh is a 73 y.o. male.    Shortness of Breath  Presents  because of left lower abdominal pain.  He states has been having abdominal pain has been increasing in nature over the past 3 to 4 days.  Patient states that because of the abdominal pain, he feels like he cannot take a deep breath because whenever he takes deep breath and subsequently hurts the left lower abdomen.  Patient states he had a history of an obstruction in the past that required surgery.  Is also history of appendectomy.  Denies any chest pain.  Patient states that he wears about 3 L of nasal cannula at home.  No hemoptysis.  No dysuria or hematuria that he is aware of.  Patient is never had any kind of kidney stones.  Is not aware of any kind of aortic aneurysms.   Previous medical history reviewed : Last admitted in January 2025.  Was admitted because of [facility] pneumonia.  COPD exacerbation.       Prior to Admission medications   Medication Sig Start Date End Date Taking? Authorizing Provider  albuterol  (VENTOLIN  HFA) 108 (90 Base) MCG/ACT inhaler INHALE 2 PUFFS BY MOUTH EVERY 4 HOURS AS NEEDED ONLY IF YOU CAN'T CATCH YOUR BREATH Patient taking differently: Inhale 1 puff into the lungs every 4 (four) hours as needed for wheezing or shortness of breath. INHALE 2 PUFFS BY MOUTH EVERY 4 HOURS AS NEEDED ONLY IF YOU CAN'T CATCH YOUR BREATH 11/09/23  Yes Darlean Ozell NOVAK, MD  cloNIDine  (CATAPRES ) 0.1 MG tablet Take 1 tablet (0.1 mg total) by mouth 2 (two) times daily. 01/26/18  Yes Darlean Ozell NOVAK, MD  Glycopyrrolate -Formoterol  (BEVESPI  AEROSPHERE) 9-4.8 MCG/ACT AERO Take 2 puffs first thing in am and then another 2 puffs about 12 hours later. 02/02/23  Yes Darlean Ozell NOVAK, MD  lidocaine  (LIDODERM ) 5 % Place 1 patch onto the skin daily.  Remove & Discard patch within 12 hours or as directed by MD 12/05/23  Yes Simon Lavonia SAILOR, MD  Oxycodone  HCl 10 MG TABS Take 0.5 tablets by mouth in the morning and at bedtime.  12/05/23 Yes [provider]  predniSONE  (DELTASONE ) 10 MG tablet Take 5 mg by mouth daily.   Yes [provider]  verapamil  (CALAN -SR) 120 MG CR tablet Take 120 mg by mouth at bedtime. 04/13/22  Yes [provider]    Allergies: Gabapentin , Aspirin, and Sulfasalazine    Review of Systems  Respiratory:  Positive for shortness of breath.     Updated Vital Signs BP 136/88   Pulse 69   Temp 98.1 F (36.7 C)   Resp 16   Ht 6' 1 (1.854 m)   Wt 78.2 kg   SpO2 96%   BMI 22.75 kg/m   Physical Exam Vitals and nursing note reviewed.  Constitutional:      General: He is not in acute distress.    Appearance: He is well-developed.  HENT:     Head: Normocephalic and atraumatic.  Eyes:     Conjunctiva/sclera: Conjunctivae normal.  Cardiovascular:     Rate and Rhythm: Normal rate and regular rhythm.     Heart sounds: No murmur heard. Pulmonary:     Effort: Pulmonary effort is normal. No respiratory distress.     Breath  sounds: Normal breath sounds.  Abdominal:     Palpations: Abdomen is soft.     Tenderness: There is no abdominal tenderness.   Musculoskeletal:        General: No swelling.     Cervical back: Neck supple.  Skin:    General: Skin is warm and dry.     Capillary Refill: Capillary refill takes less than 2 seconds.  Neurological:     Mental Status: He is alert.  Psychiatric:        Mood and Affect: Mood normal.     (all labs ordered are listed, but only abnormal results are displayed) Labs Reviewed  CBC WITH DIFFERENTIAL/PLATELET - Abnormal; Notable for the following components:      Result Value   WBC 14.1 (*)    RBC 6.71 (*)    MCV 68.6 (*)    MCH 21.0 (*)    RDW 18.1 (*)    Platelets 485 (*)    Neutro Abs 10.5 (*)    Abs Immature Granulocytes 0.14 (*)     All other components within normal limits  COMPREHENSIVE METABOLIC PANEL WITH GFR - Abnormal; Notable for the following components:   Calcium 8.7 (*)    All other components within normal limits  URINALYSIS, ROUTINE W REFLEX MICROSCOPIC - Abnormal; Notable for the following components:   Color, Urine STRAW (*)    All other components within normal limits  TROPONIN I (HIGH SENSITIVITY)  TROPONIN I (HIGH SENSITIVITY)    EKG: None  Radiology: CT Angio Chest/Abd/Pel for Dissection W and/or Wo Contrast Result Date: 12/05/2023 CLINICAL DATA:  rule out dissection. left sided abd pain with radiation to back. He does have CVA tenderness. Smoking history. EXAM: CT ANGIOGRAPHY CHEST, ABDOMEN AND PELVIS TECHNIQUE: Non-contrast CT of the chest was initially obtained. Multidetector CT imaging through the chest, abdomen and pelvis was performed using the standard protocol during bolus administration of intravenous contrast. Multiplanar reconstructed images and MIPs were obtained and reviewed to evaluate the vascular anatomy. RADIATION DOSE REDUCTION: This exam was performed according to the departmental dose-optimization program which includes automated exposure control, adjustment of the mA and/or kV according to patient size and/or use of iterative reconstruction technique. CONTRAST:  OMNIPAQUE  IOHEXOL  350 MG/ML SOLN COMPARISON:  CT chest 11/19/2021, CT chest 11/22/2022, x-ray lumbar spine 12/06/2019 FINDINGS: CTA CHEST FINDINGS Cardiovascular: Preferential opacification of the thoracic aorta. No evidence of thoracic aortic aneurysm or dissection. Normal heart size. No significant pericardial effusion. The thoracic aorta is normal in caliber. Mild atherosclerotic plaque of the thoracic aorta. Left anterior descending coronary artery calcifications. The main pulmonary artery is normal in caliber. No central pulmonary embolus. Limited evaluation more distally due to timing of contrast. Mediastinum/Nodes:  No enlarged mediastinal, hilar, or axillary lymph nodes. Thyroid gland, trachea, and esophagus demonstrate no significant findings. Lungs/Pleura: Biapical pleural/pulmonary scarring. Severe centrilobular emphysematous changes. No focal consolidation. Stable left lower lobe calcified pulmonary micronodule. Stable several left lower lobe subpleural micronodules. No pulmonary mass. No pleural effusion. No pneumothorax. Musculoskeletal: No chest wall abnormality. No suspicious lytic or blastic osseous lesions. No acute displaced fracture. Chronic T7, T8, T9, T10 anterior wedge compression fracture status post kyphoplasty of the T8-T9 levels. Review of the MIP images confirms the above findings. CTA ABDOMEN AND PELVIS FINDINGS VASCULAR Aorta: Moderate severe atherosclerotic plaque. Normal caliber aorta without aneurysm, dissection, vasculitis or significant stenosis. Celiac: Patent without evidence of aneurysm, dissection, vasculitis or significant stenosis. SMA: Patent without evidence of aneurysm, dissection, vasculitis or  significant stenosis. Renals: Both renal arteries are patent without evidence of aneurysm, dissection, vasculitis, fibromuscular dysplasia or significant stenosis. IMA: Patent without evidence of aneurysm, dissection, vasculitis or significant stenosis. Inflow: Moderate atherosclerotic plaque. Patent without evidence of aneurysm, dissection, vasculitis or significant stenosis. Veins: No obvious venous abnormality within the limitations of this arterial phase study. Review of the MIP images confirms the above findings. NON-VASCULAR Hepatobiliary: No focal liver abnormality. No gallstones, gallbladder wall thickening, or pericholecystic fluid. No biliary dilatation. Pancreas: No focal lesion. Normal pancreatic contour. No surrounding inflammatory changes. No main pancreatic ductal dilatation. Spleen: Normal in size without focal abnormality. Adrenals/Urinary Tract: No adrenal nodule bilaterally.  Bilateral kidneys enhance symmetrically. Fluid density lesions of the kidneys likely represent simple renal cysts. No hydronephrosis. No hydroureter.  No nephroureterolithiasis. The urinary bladder is unremarkable. Stomach/Bowel: Stomach is within normal limits. No evidence of bowel wall thickening or dilatation. Status post appendectomy. Lymphatic: No lymphadenopathy. Reproductive: Prostate is enlarged measuring up to 5.3 cm. Bilateral vasectomy. Other: No intraperitoneal free fluid. No intraperitoneal free gas. No organized fluid collection. Musculoskeletal: Tiny fat containing umbilical hernia. No suspicious lytic or blastic osseous lesions. No acute displaced fracture. Six lumbar non rib-bearing vertebral bodies. Chronic L5 compression fracture status post kyphoplasty. Review of the MIP images confirms the above findings. IMPRESSION: 1. No acute thoracic or abdominal aorta abnormality. 2. No central pulmonary embolus. Limited evaluation more distally due to timing of contrast. 3. No acute intrathoracic, intra-abdominal, intrapelvic abnormality. 4. Emphysema (ICD10-J43.9)-severe. 5.  Aortic Atherosclerosis (ICD10-I70.0)-severe. 6. Multiple stable left lower lobe pulmonary micronodules-no further follow-up indicated. Electronically Signed   By: Morgane  Naveau M.D.   On: 12/05/2023 21:05   DG Chest 2 View Result Date: 12/05/2023 CLINICAL DATA:  SOB EXAM: CHEST - 2 VIEW COMPARISON:  July 26, 2023, November 22, 2022 FINDINGS: The lateral aspect of the left lung apex was excluded from the field of view. Biapical pleural thickening. Emphysema. No focal airspace consolidation, pleural effusion, or pneumothorax. No cardiomegaly. Tortuous aorta with aortic atherosclerosis. No acute fracture or destructive lesions. Multilevel thoracic osteophytosis. Diffuse osteopenia. Midthoracic vertebroplasty cement again noted. Unchanged, untreated mild chronic compression fracture also noted in the midthoracic spine. IMPRESSION:  Emphysema.  Otherwise, no acute cardiopulmonary abnormality. Electronically Signed   By: Rogelia Myers M.D.   On: 12/05/2023 19:16     Procedures   Medications Ordered in the ED  lidocaine  (LIDODERM ) 5 % 1 patch (1 patch Transdermal Patch Applied 12/05/23 2301)  morphine  (PF) 4 MG/ML injection 2 mg (2 mg Intravenous Given 12/05/23 2006)  iohexol  (OMNIPAQUE ) 350 MG/ML injection 100 mL (100 mLs Intravenous Contrast Given 12/05/23 2036)  morphine  (PF) 4 MG/ML injection 2 mg (2 mg Intravenous Given 12/05/23 2133)  oxyCODONE  (Oxy IR/ROXICODONE ) immediate release tablet 5 mg (5 mg Oral Given 12/05/23 2301)                                    Medical Decision Making Amount and/or Complexity of Data Reviewed Labs: ordered. Radiology: ordered.  Risk Prescription drug management.    Presents  because of left lower abdominal pain.  He states has been having abdominal pain has been increasing in nature over the past 3 to 4 days.  Patient states that because of the abdominal pain, he feels like he cannot take a deep breath because whenever he takes deep breath and subsequently hurts the left lower abdomen.  Patient states  he had a history of an obstruction in the past that required surgery.  Is also history of appendectomy.  Denies any chest pain.  Patient states that he wears about 3 L of nasal cannula at home.  No hemoptysis.  No dysuria or hematuria that he is aware of.  Patient is never had any kind of kidney stones.  Is not aware of any kind of aortic aneurysms.   Previous medical history reviewed : Last admitted in January 2025.  Was admitted because of [facility] pneumonia.  COPD exacerbation.     On exam, patient on typical 3 L.  98% on room air.  No acute distress.  Hemodynamically stable.  Maps appropriate.  Afebrile.   Patient does have pain to palpation left lower quadrant.   EKG reviewed.  No STEMI.  Patient has no chest pain.  Obtain troponin x 2.  Unremarkable.  Patient can  follow-up outpatient but given lack of chest pain, no further workup needed.  Patient was not have any kind of chest pain.  Did obtain troponin workup in the setting of some slight shortness of breath.  Given the location of pain smoking history, radiation of pain, I did obtain a CTA of the chest abdomen pelvis to rule out any kind of dissection or abdominal aortic aneurysm.  This was unremarkable for acute vascular pathology.  No ischemic pathology was seen.  Also did not show any evidence of diverticulitis or colitis.  No evidence of ileus or obstruction.  No bowel mildly.  No evidence of nephrolithiasis.  No hydroureter.   Reexamined the patient.  I wonder if this could be more musculoskeletal.  Seems to hurt with certain movements.  Recommended lidocaine  patch.  Strict return precautions.  UA unremarkable.  No UTI.  In terms of patient's leukocytosis, patient has known chronic leukocytosis.  Referred him to hematology for outpatient workup.       Final diagnoses:  Left lower quadrant abdominal pain  Muscle strain    ED Discharge Orders          Ordered    lidocaine  (LIDODERM ) 5 %  Every 24 hours        12/05/23 2319               Simon Lavonia SAILOR, MD 12/05/23 2324

## 2023-12-05 NOTE — ED Notes (Signed)
 Pt still reporting pain of 9/10. MD notified.

## 2023-12-14 ENCOUNTER — Other Ambulatory Visit: Payer: Self-pay

## 2023-12-14 ENCOUNTER — Emergency Department (HOSPITAL_COMMUNITY)

## 2023-12-14 ENCOUNTER — Emergency Department (HOSPITAL_COMMUNITY)
Admission: EM | Admit: 2023-12-14 | Discharge: 2023-12-14 | Disposition: A | Discharge status: Home / Self Care | Attending: Emergency Medicine | Admitting: Emergency Medicine

## 2023-12-14 ENCOUNTER — Encounter (HOSPITAL_COMMUNITY): Payer: Self-pay | Admitting: *Deleted

## 2023-12-14 DIAGNOSIS — R1012 Left upper quadrant pain: Secondary | ICD-10-CM | POA: Insufficient documentation

## 2023-12-14 DIAGNOSIS — D72829 Elevated white blood cell count, unspecified: Secondary | ICD-10-CM | POA: Diagnosis not present

## 2023-12-14 DIAGNOSIS — J449 Chronic obstructive pulmonary disease, unspecified: Secondary | ICD-10-CM | POA: Insufficient documentation

## 2023-12-14 DIAGNOSIS — I10 Essential (primary) hypertension: Secondary | ICD-10-CM | POA: Diagnosis not present

## 2023-12-14 LAB — URINALYSIS, ROUTINE W REFLEX MICROSCOPIC
Bacteria, UA: NONE SEEN
Bilirubin Urine: NEGATIVE
Glucose, UA: NEGATIVE mg/dL
Hgb urine dipstick: NEGATIVE
Ketones, ur: NEGATIVE mg/dL
Nitrite: NEGATIVE
Protein, ur: NEGATIVE mg/dL
Specific Gravity, Urine: 1.013 (ref 1.005–1.030)
pH: 5 (ref 5.0–8.0)

## 2023-12-14 LAB — COMPREHENSIVE METABOLIC PANEL WITH GFR
ALT: 23 U/L (ref 0–44)
AST: 25 U/L (ref 15–41)
Albumin: 4 g/dL (ref 3.5–5.0)
Alkaline Phosphatase: 67 U/L (ref 38–126)
Anion gap: 11 (ref 5–15)
BUN: 11 mg/dL (ref 8–23)
CO2: 24 mmol/L (ref 22–32)
Calcium: 8.8 mg/dL — ABNORMAL LOW (ref 8.9–10.3)
Chloride: 102 mmol/L (ref 98–111)
Creatinine, Ser: 0.73 mg/dL (ref 0.61–1.24)
GFR, Estimated: >60 mL/min (ref >60)
Glucose, Bld: 153 mg/dL — ABNORMAL HIGH (ref 70–99)
Potassium: 4.2 mmol/L (ref 3.5–5.1)
Sodium: 137 mmol/L (ref 135–145)
Total Bilirubin: 0.9 mg/dL (ref 0.0–1.2)
Total Protein: 6.6 g/dL (ref 6.5–8.1)

## 2023-12-14 LAB — CBC
HCT: 45 % (ref 39.0–52.0)
Hemoglobin: 14.2 g/dL (ref 13.0–17.0)
MCH: 21.6 pg — ABNORMAL LOW (ref 26.0–34.0)
MCHC: 31.6 g/dL (ref 30.0–36.0)
MCV: 68.6 fL — ABNORMAL LOW (ref 80.0–100.0)
Platelets: 458 K/uL — ABNORMAL HIGH (ref 150–400)
RBC: 6.56 MIL/uL — ABNORMAL HIGH (ref 4.22–5.81)
RDW: 17.8 % — ABNORMAL HIGH (ref 11.5–15.5)
WBC: 16 K/uL — ABNORMAL HIGH (ref 4.0–10.5)
nRBC: 0 % (ref 0.0–0.2)

## 2023-12-14 LAB — LIPASE, BLOOD: Lipase: 23 U/L (ref 11–51)

## 2023-12-14 LAB — MAGNESIUM: Magnesium: 2.1 mg/dL (ref 1.7–2.4)

## 2023-12-14 MED ORDER — IOHEXOL 300 MG/ML  SOLN
100.0000 mL | Freq: Once | INTRAMUSCULAR | Status: AC | PRN
  Administered 2023-12-14: 100 mL via INTRAVENOUS

## 2023-12-14 MED ORDER — OXYCODONE-ACETAMINOPHEN 5-325 MG PO TABS: 1.0000 | ORAL_TABLET | ORAL | 0 refills | Status: AC | PRN

## 2023-12-14 MED ORDER — HYDROMORPHONE HCL 1 MG/ML IJ SOLN
0.5000 mg | Freq: Once | INTRAMUSCULAR | Status: AC
  Administered 2023-12-14: 0.5 mg via INTRAVENOUS
  Filled 2023-12-14: qty 0.5

## 2023-12-14 MED ORDER — ONDANSETRON HCL 4 MG/2ML IJ SOLN
4.0000 mg | Freq: Once | INTRAMUSCULAR | Status: AC
  Administered 2023-12-14: 4 mg via INTRAVENOUS
  Filled 2023-12-14: qty 2

## 2023-12-14 MED ORDER — SODIUM CHLORIDE 0.9 % IV BOLUS
1000.0000 mL | Freq: Once | INTRAVENOUS | Status: AC
  Administered 2023-12-14: 1000 mL via INTRAVENOUS

## 2023-12-14 NOTE — ED Notes (Signed)
 Bladder scan completed at this time. Bladder scanned around . Pt tolerated well. Provider Tammy PA notified

## 2023-12-14 NOTE — Discharge Instructions (Signed)
 You may take the pain medication as directed.  Continue your antibiotics Contact the GI provider listed to arrange follow-up appointment.  Also as discussed, a small lesion was seen on the left kidney.  It was recommended to have an outpatient MRI for further evaluation of this.  This is likely an incidental finding, but still needs further evaluation.  Call the urology group listed to arrange a follow-up appointment regarding this.  Return to the emergency department if you develop any new or worsening symptoms.

## 2023-12-14 NOTE — ED Triage Notes (Signed)
 Pt with abd pain and lower back pain.  Pt started antibiotics for infection last Friday.  + nausea.  LBM 7/22

## 2023-12-14 NOTE — ED Provider Notes (Signed)
 [place] EMERGENCY DEPARTMENT AT [facility] Provider Note   CSN: [id number] Arrival date & time: 12/14/23  1419     Patient presents with: Abdominal Pain   Robert Pugh is a 73 y.o. male.    Abdominal Pain Associated symptoms: nausea   Associated symptoms: no chest pain, no constipation, no diarrhea, no fever, no hematuria and no shortness of breath        Robert Pugh is a 73 y.o. male with past medical history of hypertension, COPD, chronic respiratory failure with hypoxia, is on 3 L O2 chronically who presents to the Emergency Department complaining of persistent left lower abdominal pain.  Seen here on 12/05/2023 with similar symptoms.  He underwent CT angio of the chest abdomen and pelvis for dissection study.  He had follow-up with his primary care provider few days later.  Was concern for possible diverticulitis and patient was started on Cipro and Flagyl for symptoms.  He states since taking the antibiotic his symptoms have not improved and he is now experiencing nausea.  Pain radiates to his left flank as well.  Pain seems to be positional.  He denies any vomiting or fever.  Denies any black or bloody stools.  Is having some urinary hesitancy.  Able to void on his own but states that he is voiding small amounts of urine.  No history of prior kidney stones.  Prior to Admission medications   Medication Sig Start Date End Date Taking? Authorizing Provider  albuterol  (VENTOLIN  HFA) 108 (90 Base) MCG/ACT inhaler INHALE 2 PUFFS BY MOUTH EVERY 4 HOURS AS NEEDED ONLY IF YOU CAN'T CATCH YOUR BREATH Patient taking differently: Inhale 1 puff into the lungs every 4 (four) hours as needed for wheezing or shortness of breath. INHALE 2 PUFFS BY MOUTH EVERY 4 HOURS AS NEEDED ONLY IF YOU CAN'T CATCH YOUR BREATH 11/09/23   Darlean Ozell NOVAK, MD  cloNIDine  (CATAPRES ) 0.1 MG tablet Take 1 tablet (0.1 mg total) by mouth 2 (two) times daily. 01/26/18   Darlean Ozell NOVAK, MD   Glycopyrrolate -Formoterol  (BEVESPI  AEROSPHERE) 9-4.8 MCG/ACT AERO Take 2 puffs first thing in am and then another 2 puffs about 12 hours later. 02/02/23   Darlean Ozell NOVAK, MD  lidocaine  (LIDODERM ) 5 % Place 1 patch onto the skin daily. Remove & Discard patch within 12 hours or as directed by MD 12/05/23   Simon Lavonia SAILOR, MD  predniSONE  (DELTASONE ) 10 MG tablet Take 5 mg by mouth daily.    [provider]  verapamil  (CALAN -SR) 120 MG CR tablet Take 120 mg by mouth at bedtime. 04/13/22   [provider]    Allergies: Gabapentin , Aspirin, and Sulfasalazine    Review of Systems  Constitutional:  Negative for appetite change and fever.  Respiratory:  Negative for shortness of breath.   Cardiovascular:  Negative for chest pain.  Gastrointestinal:  Positive for abdominal pain and nausea. Negative for constipation and diarrhea.  Genitourinary:  Positive for decreased urine volume, difficulty urinating and flank pain. Negative for hematuria.  Skin:  Negative for rash.  Neurological:  Negative for dizziness and headaches.    Updated Vital Signs BP (!) 117/92   Pulse 69   Temp 98.4 F (36.9 C) (Oral)   Resp 18   Ht 6' 1 (1.854 m)   Wt 74.4 kg   SpO2 93%   BMI 21.64 kg/m   Physical Exam Vitals and nursing note reviewed.  Constitutional:      Appearance: He  is well-developed. He is not toxic-appearing.  HENT:     Mouth/Throat:     Mouth: Mucous membranes are moist.  Cardiovascular:     Rate and Rhythm: Normal rate and regular rhythm.     Pulses: Normal pulses.  Abdominal:     General: There is no distension.     Palpations: Abdomen is soft. There is no mass.     Tenderness: There is abdominal tenderness. There is no right CVA tenderness or left CVA tenderness.     Comments: Left lower quadrant tenderness on exam.  No guarding or rebound tenderness.  Abdomen is soft.  Do not appreciate any left-sided CVA tenderness  Musculoskeletal:     Right lower leg: No edema.      Left lower leg: No edema.  Skin:    General: Skin is warm.     Capillary Refill: Capillary refill takes less than 2 seconds.  Neurological:     General: No focal deficit present.     Mental Status: He is alert.     Sensory: No sensory deficit.     Motor: No weakness.     (all labs ordered are listed, but only abnormal results are displayed) Labs Reviewed  COMPREHENSIVE METABOLIC PANEL WITH GFR - Abnormal; Notable for the following components:      Result Value   Glucose, Bld 153 (*)    Calcium 8.8 (*)    All other components within normal limits  CBC - Abnormal; Notable for the following components:   WBC 16.0 (*)    RBC 6.56 (*)    MCV 68.6 (*)    MCH 21.6 (*)    RDW 17.8 (*)    Platelets 458 (*)    All other components within normal limits  URINALYSIS, ROUTINE W REFLEX MICROSCOPIC - Abnormal; Notable for the following components:   Leukocytes,Ua TRACE (*)    All other components within normal limits  LIPASE, BLOOD  MAGNESIUM    EKG: None  Radiology: CT ABDOMEN PELVIS W CONTRAST Result Date: 12/14/2023 CLINICAL DATA:  LLQ abdominal pain LLQ pain, nausea. Pt with abd pain and lower back pain. Pt started antibiotics for infection last Friday. + nausea. LBM 7/22 EXAM: CT ABDOMEN AND PELVIS WITH CONTRAST TECHNIQUE: Multidetector CT imaging of the abdomen and pelvis was performed using the standard protocol following bolus administration of intravenous contrast. RADIATION DOSE REDUCTION: This exam was performed according to the departmental dose-optimization program which includes automated exposure control, adjustment of the mA and/or kV according to patient size and/or use of iterative reconstruction technique. CONTRAST:  OMNIPAQUE  IOHEXOL  300 MG/ML  SOLN COMPARISON:  CT angiography 12/05/2023 FINDINGS: Lower chest: Emphysematous changes. Hepatobiliary: No focal liver abnormality. No gallstones, gallbladder wall thickening, or pericholecystic fluid. No biliary dilatation.  Pancreas: Diffusely atrophic. No focal lesion. Otherwise normal pancreatic contour. No surrounding inflammatory changes. No main pancreatic ductal dilatation. Spleen: Normal in size without focal abnormality. Adrenals/Urinary Tract: No adrenal nodule bilaterally. Bilateral kidneys enhance symmetrically fluid. Dense lesions of the kidneys likely represent simple renal cysts. Simple renal cysts, in the absence of clinically indicated signs/symptoms, require no independent follow-up. Subcentimeter hypodensities are too small characterize-no further follow-up indicated. There is a 1.5 x 1.4 cm left renal hypodensity with a density of 35 Hounsfield units. No hydronephrosis. No hydroureter.  No nephroureterolithiasis. The urinary bladder is unremarkable. Stomach/Bowel: Stomach is within normal limits. No evidence of bowel wall thickening or dilatation. Stool throughout the ascending and transverse colon. Redundant descending and sigmoid  colon. Status post appendectomy. Vascular/Lymphatic: Prominent left gonadal vein of unclear etiology. No abdominal aorta or iliac aneurysm. Severe atherosclerotic plaque of the aorta and its branches. No abdominal, pelvic, or inguinal lymphadenopathy. Reproductive: Prostate is enlarged measuring up to 6 cm. Bilateral vasectomy. Other: No intraperitoneal free fluid. No intraperitoneal free gas. No organized fluid collection. Musculoskeletal: No abdominal wall hernia or abnormality. No suspicious lytic or blastic osseous lesions. No acute displaced fracture. L4 compression fracture status post kyphoplasty. IMPRESSION: 1. Markedly redundant descending colon and sigmoid colon with no signs of volvulus. 2. An indeterminate 1.5 x 1.4 cm left renal hypodensity. When the patient is clinically stable and able to follow directions and hold their breath (preferably as an outpatient) further evaluation with dedicated MRI renal protocol should be considered. 3. Prostatomegaly. 4. Aortic Atherosclerosis  (ICD10-I70.0) and Emphysema (ICD10-J43.9). Electronically Signed   By: Morgane  Naveau M.D.   On: 12/14/2023 17:32     Procedures   Medications Ordered in the ED  sodium chloride  0.9 % bolus 1,000 mL (1,000 mLs Intravenous New Bag/Given 12/14/23 1544)  ondansetron  (ZOFRAN ) injection 4 mg (4 mg Intravenous Given 12/14/23 1544)  HYDROmorphone  (DILAUDID ) injection 0.5 mg (0.5 mg Intravenous Given 12/14/23 1544)  iohexol  (OMNIPAQUE ) 300 MG/ML solution 100 mL (100 mLs Intravenous Contrast Given 12/14/23 1616)                                    Medical Decision Making Patient here with continued left-sided mostly lower abdominal pain.  States the pain worsens with certain movements now associated with nausea.  Began Cipro and Flagyl on Friday without improvement of symptoms.  States his white count has been elevated but has known chronic leukocytosis, no vomiting bowel changes, melena or hematochezia.  He is also chronically on 3 L O2 denies any chest pain or shortness of breath.  Postvoid bladder scan shows 200 cc of urine.  CT angio of the chest abdomen pelvis from previous ER visit without evidence of vascular compromise.  CT imaging also without obvious cause of patient's abdominal pain.  With urinary hesitancy, possible obstructive uropathy versus infection, possible developing diverticulitis    Amount and/or Complexity of Data Reviewed Labs: ordered.    Details: Leukocytosis with white count of 16,000 today, hemoglobin reassuring.  Lipase unremarkable.  Chemistries without significant derangement.  Urinalysis without evidence of infection or hematuria magnesium unremarkable Radiology: ordered.    Details: CT abdomen and pelvis ordered today for further evaluation.  CT abdomen pelvis with markedly redundant descending colon and sigmoid colon without signs of volvulus.  There is a 1-1/2 x 1-1/2 renal hypodensity on the left with radiology recommended renal MRI outpatient for further  evaluation Discussion of management or test interpretation with external provider(s): Pt has received IV pain medication here, pain has improved although not resolved.  The cause of his LLQ pain is unclear, it is considered that adhesions may be cause.  I have offered hospital admission for intractable pain, but pt declined.  States that he prefers to go home and PCP is trying to arrange appt with [facility].  He is also agreeable to local GI f/u.  I will provide f/u info and I have also discussed the hypodensity seen on the left kidney.  I suspect this is incidental finding, but he is agreeable to close out pt f/u with urology as well.  He was given strict return precautions  Risk  Prescription drug management.        Final diagnoses:  Left upper quadrant abdominal pain    ED Discharge Orders     None          Herlinda Milling, PA-C 12/16/23 1429    Towana Ozell BROCKS, MD 12/18/23 1140

## 2023-12-15 ENCOUNTER — Telehealth: Payer: Self-pay | Admitting: *Deleted

## 2023-12-15 ENCOUNTER — Telehealth: Payer: Self-pay | Admitting: Acute Care

## 2023-12-15 NOTE — Telephone Encounter (Signed)
 I have attempted to call the patient with the results of their  Low Dose CT Chest Lung cancer screening scan. There was no answer. I have left a HIPPA compliant VM requesting the patient call the office for the scan results. I included the office contact information in the message. We will await his return call. If no return call we will continue to call until patient is contacted.    If he calls back can we ask him if he has been sick? If he has, he needs to go to his PCP and get treated, then repeat LDCT in 1 month to 6 weeks.

## 2023-12-15 NOTE — Telephone Encounter (Signed)
 Call report from [facility] Radiology:  IMPRESSION: Lung-RADS 4B, suspicious. Additional imaging evaluation or consultation with Pulmonology or Thoracic Surgery recommended. 8.7 mm posterior right upper lobe nodule is new in the interval.   Aortic Atherosclerosis (ICD10-I70.0) and Emphysema (ICD10-J43.9).   These results will be called to the ordering clinician or representative by the Radiologist Assistant, and communication documented in the PACS or [employer].

## 2023-12-18 ENCOUNTER — Ambulatory Visit: Admitting: Internal Medicine

## 2023-12-18 NOTE — Telephone Encounter (Signed)
 See provider note 12/15/2023

## 2023-12-19 ENCOUNTER — Other Ambulatory Visit: Payer: Self-pay

## 2023-12-19 DIAGNOSIS — Z87891 Personal history of nicotine dependence: Secondary | ICD-10-CM

## 2023-12-19 DIAGNOSIS — R911 Solitary pulmonary nodule: Secondary | ICD-10-CM

## 2023-12-19 DIAGNOSIS — Z122 Encounter for screening for malignant neoplasm of respiratory organs: Secondary | ICD-10-CM

## 2023-12-19 NOTE — Telephone Encounter (Signed)
 Spouse called back and LVM. Advised they want to discuss CT results with their PCP prior to scheduling 6 week follow up.

## 2023-12-19 NOTE — Telephone Encounter (Signed)
 Spoke with patient's spouse Clarita, on [place] and reviewed results. She has also called him and relayed message. They are in agreement to complete a 6 week follow up scan. He has not had any recent respiratory illness. Advises he has been in the hospital and on antibiotics for stomach issues. Pt to see PCP this week for follow up appt and will discuss CT results. Plan and results to PCP. 6 week order placed. Pt/wife to call back and schedule LDCT due 01/22/2024.

## 2023-12-22 NOTE — Telephone Encounter (Signed)
 Patient returned call and scheduled 6 week f/u scan for 01/15/2024 at [facility]

## 2023-12-27 ENCOUNTER — Telehealth: Payer: Self-pay | Admitting: *Deleted

## 2023-12-27 ENCOUNTER — Ambulatory Visit (INDEPENDENT_AMBULATORY_CARE_PROVIDER_SITE_OTHER): Admitting: Gastroenterology

## 2023-12-27 ENCOUNTER — Encounter (INDEPENDENT_AMBULATORY_CARE_PROVIDER_SITE_OTHER): Payer: Self-pay | Admitting: Gastroenterology

## 2023-12-27 ENCOUNTER — Encounter: Payer: Self-pay | Admitting: *Deleted

## 2023-12-27 VITALS — BP 150/75 | HR 87 | Temp 98.4°F | Ht 72.0 in | Wt 162.1 lb

## 2023-12-27 DIAGNOSIS — R112 Nausea with vomiting, unspecified: Secondary | ICD-10-CM

## 2023-12-27 DIAGNOSIS — K5900 Constipation, unspecified: Secondary | ICD-10-CM

## 2023-12-27 DIAGNOSIS — R109 Unspecified abdominal pain: Secondary | ICD-10-CM | POA: Insufficient documentation

## 2023-12-27 DIAGNOSIS — R1112 Projectile vomiting: Secondary | ICD-10-CM | POA: Insufficient documentation

## 2023-12-27 DIAGNOSIS — R1012 Left upper quadrant pain: Secondary | ICD-10-CM | POA: Diagnosis not present

## 2023-12-27 DIAGNOSIS — K5904 Chronic idiopathic constipation: Secondary | ICD-10-CM | POA: Insufficient documentation

## 2023-12-27 NOTE — Patient Instructions (Signed)
 It was very nice to meet you today, as dicussed with will plan for the following :  1) Will refer you for trigger point injection   2) Linzess , if it helps you please call us   3) gastric emptying study   4) Please follow up with you pcp for left kindey lesion seen on recent CT

## 2023-12-27 NOTE — Telephone Encounter (Signed)
 Availty PA for [employer]:  Your request for [id number] has been submitted. The following procedures are approved due to the reasons given below based on member's group information benefits and service type.  Procedure code Description  Determination  Reason [phone] GASTRIC EMPTYING IMAGING STUDY (EG, SOLID, LIQUID, OR BOTH); Approved Medical Necessity

## 2023-12-27 NOTE — Progress Notes (Signed)
 Sherrod Toothman Robert Haide Klinker , M.D. Gastroenterology & Hepatology [facility] [address] [place], [place] [address] Primary Care Physician: Blinda Milling, FNP [address] [place] [address]  Chief Complaint: Constipation, chronic left upper quadrant abdominal pain History of Present Illness:  Robert Pugh is a 73 y.o. male with COPD on 3 L oxygen, hypertension, rheumatoid arthritis on chronic prednisone  who presents for evaluation of Constipation, chronic left upper quadrant abdominal pain  Patient appears to have chronic left upper quadrant pain as he was seen by GI in 2019 as well.  Patient previously had extensive evaluation as summarized below  Patient symptoms started in 1991 after SBO -Exlap and pain has persisted since than. Patient recently went to the ER twice because of this continued pain.  CT was negative to explain the cause of this severe pain.  Pain severity does changes with movement but no relationship with defecation.  Patient does report pain sometimes worsens with food intake where he would feel nauseous .  Last labs from 06/30/2023 normal liver enzymes hemoglobin 14.3  Available records reviewed and evaluation to date notable for:  2018: January: Ex-lap with LOA for SBO per patient in [place] May: Appendectomy for acute appendicitis per patient in [place]   2014: [facility] (Dr. Gaither): Left T10, T11, T12 intercostal block 02/2013, reporting initial 100% improvement at time of f/u. Recommend observation and if recurrence, plan for either repeat injection or intercostal ablative procedure [facility] ([facility] [place]): MRI abd/pelvis normal with referral to Pain Management   Prior to 2014, had an extensive evaluation in [place] by Internal Medicine, Genral Surgery, GI, and Neurology, which was essentially unrevealing for etiology, including: Colonoscopy x2 (2010, 2011), EGD (2012), VCE (?), CT, serologic eval, ex lap    Last EGD:2012 Last Colonoscopy:2018 at [place] and was suggested to repeat 10 years    Past Medical History: Past Medical History:  Diagnosis Date   Arthritis    Bowel obstruction (HCC)    Chronic headaches    COPD (chronic obstructive pulmonary disease) (HCC)    Dyspnea    GERD (gastroesophageal reflux disease)    High blood pressure    Kidney stone    Pneumonia     Past Surgical History: Past Surgical History:  Procedure Laterality Date   APPENDECTOMY  09/2016   BACK SURGERY     HERNIA REPAIR Right 2006   KNEE SURGERY Left    KYPHOPLASTY  09/2019   KYPHOPLASTY N/A 04/28/2022   Procedure: T7 KYPHOPLASTY;  Surgeon: Gillie Duncans, MD;  Location: [facility] OR;  Service: Neurosurgery;  Laterality: N/A;  RM 18 to follow 3C   KYPHOPLASTY N/A 06/16/2022   Procedure: KYPHOPLASTY COMPRESSION FRACTURE THORACIC EIGHT;  Surgeon: Gillie Duncans, MD;  Location: [facility] OR;  Service: Neurosurgery;  Laterality: N/A;  RM 21   Left shoulder repair      LUMBAR LAMINECTOMY/DECOMPRESSION MICRODISCECTOMY Right 12/06/2019   Procedure: Right Lumbar Three-Four Far Lateral Discectomy;  Surgeon: Gillie Duncans, MD;  Location: [facility] OR;  Service: Neurosurgery;  Laterality: Right;   plural infusion  1991   ROTATOR CUFF REPAIR Right     Family History: Family History  Problem Relation Age of Onset   Diabetes Mother    Multiple sclerosis Mother    Heart disease Father    Congestive Heart Failure Father    Esophageal cancer Neg Hx    Colon cancer Neg Hx    Pancreatic cancer Neg Hx    Prostate cancer  Neg Hx     Social History: Social History   Tobacco Use  Smoking Status Every Day   Current packs/day: 0.50   Average packs/day: 0.5 packs/day for 40.0 years (20.0 ttl pk-yrs)   Types: Cigarettes  Smokeless Tobacco Never  Tobacco Comments   Less than 1/2 ppd 06-25-2021   Social History   Substance and Sexual Activity  Alcohol Use Not Currently   Social History   Substance and Sexual Activity  Drug  Use Never    Allergies: Allergies  Allergen Reactions   Gabapentin  Other (See Comments)    Arms and legs jerking, vision changes   Aspirin Other (See Comments)    Stomach upset, Stomach sensitive   Sulfasalazine Dermatitis    Medications: Current Outpatient Medications  Medication Sig Dispense Refill   albuterol  (VENTOLIN  HFA) 108 (90 Base) MCG/ACT inhaler INHALE 2 PUFFS BY MOUTH EVERY 4 HOURS AS NEEDED ONLY IF YOU CAN'T CATCH YOUR BREATH 8.5 g 5   cloNIDine  (CATAPRES ) 0.1 MG tablet Take 1 tablet (0.1 mg total) by mouth 2 (two) times daily. 60 tablet 11   Glycopyrrolate -Formoterol  (BEVESPI  AEROSPHERE) 9-4.8 MCG/ACT AERO Take 2 puffs first thing in am and then another 2 puffs about 12 hours later. 1 each 11   oxyCODONE -acetaminophen  (PERCOCET/ROXICET) 5-325 MG tablet Take 1 tablet by mouth every 4 (four) hours as needed. 10 tablet 0   predniSONE  (DELTASONE ) 10 MG tablet Take 5 mg by mouth daily.     verapamil  (CALAN -SR) 120 MG CR tablet Take 120 mg by mouth at bedtime.     No current facility-administered medications for this visit.    Review of Systems: GENERAL: negative for malaise, night sweats HEENT: No changes in hearing or vision, no nose bleeds or other nasal problems. NECK: Negative for lumps, goiter, pain and significant neck swelling RESPIRATORY: Negative for cough, wheezing CARDIOVASCULAR: Negative for chest pain, leg swelling, palpitations, orthopnea GI: SEE HPI MUSCULOSKELETAL: Negative for joint pain or swelling, back pain, and muscle pain. SKIN: Negative for lesions, rash HEMATOLOGY Negative for prolonged bleeding, bruising easily, and swollen nodes. ENDOCRINE: Negative for cold or heat intolerance, polyuria, polydipsia and goiter. NEURO: negative for tremor, gait imbalance, syncope and seizures. The remainder of the review of systems is noncontributory.   Physical Exam: BP (!) 150/75   Pulse 87   Temp 98.4 F (36.9 C)   Ht 6' (1.829 m)   Wt 162 lb 1.6 oz  (73.5 kg)   BMI 21.98 kg/m  GENERAL: The patient is AO x3, in no acute distress. HEENT: Head is normocephalic and atraumatic. EOMI are intact. Mouth is well hydrated and without lesions. NECK: Supple. No masses LUNGS: Clear to auscultation. No presence of rhonchi/wheezing/rales. Adequate chest expansion HEART: RRR, normal s1 and s2. ABDOMEN: Soft, left mid abdomen tenderness no guarding, no peritoneal signs, and nondistended. BS +. No masses.  Positive Carnett's sign    Imaging/Labs: as above     Latest Ref Rng & Units 12/14/2023    3:09 PM 12/05/2023    6:51 PM 06/09/2023    5:07 AM  CBC  WBC 4.0 - 10.5 K/uL 16.0  14.1  34.2   Hemoglobin 13.0 - 17.0 g/dL 85.7  85.8  87.2   Hematocrit 39.0 - 52.0 % 45.0  46.0  40.1   Platelets 150 - 400 K/uL 458  485  451    No results found for: IRON, TIBC, FERRITIN  I personally reviewed and interpreted the available labs, imaging and endoscopic files.  CT abdomen and pelvis 11/2023  IMPRESSION: 1. Markedly redundant descending colon and sigmoid colon with no signs of volvulus. 2. An indeterminate 1.5 x 1.4 cm left renal hypodensity. When the patient is clinically stable and able to follow directions and hold their breath (preferably as an outpatient) further evaluation with dedicated MRI renal protocol should be considered. 3. Prostatomegaly. 4. Aortic Atherosclerosis (ICD10-I70.0) and Emphysema (ICD10-J43.9).  Impression and Plan:  Edrees Valent is a 73 y.o. male with COPD on 3 L oxygen, hypertension, rheumatoid arthritis on chronic prednisone  who presents for evaluation of Constipation, chronic left upper quadrant abdominal pain  #Abdominal pain  Patient has left upper quadrant pain present for 10+ years appears which to be neuropathic or musculoskeletal in nature  Patient had extensive workup including upper endoscopy ,multiple colonoscopy last performed 2018 as per the patient and had recent CT without explanation of  this persistent pain  On exam today patient has significant tenderness and a positive Carnett's sign which suggest that he might have abdominal wall pain syndrome/ 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  Last performed 2018 at [facility] as per the patient and suggest repeat was 10 years  #Constipation   Patient has chronic intermittent constipation somewhat responsive to MiraLAX  Linaclotide  is contraindicated in patients with known or suspected mechanical GI obstruction . The FDA label does not distinguish between current and resolved obstruction.  However the contraindication specifically applies to dose with active or suspected obstruction, not to those with a remote history of obstruction that has fully resolved and is no longer present  Hence we will give a trial of of Linzess  and uptitrate as tolerated  Ensure adequate fluid intake: Aim for 8 glasses of water daily. Follow a high fiber diet: Include foods such as dates, prunes, pears, and kiwi. Take Miralax twice a day for the first week, then reduce to once daily thereafter. Use Metamucil twice a day.  All questions were answered.      Robert Pugh Robert Pleasant Britz, MD Gastroenterology and Hepatology [facility]   This chart has been completed using [facility] Dictation software, and while attempts have been made to ensure accuracy , certain words and phrases may not be transcribed as intended

## 2024-01-01 ENCOUNTER — Ambulatory Visit (HOSPITAL_COMMUNITY)
Admission: RE | Admit: 2024-01-01 | Discharge: 2024-01-01 | Disposition: A | Discharge status: Home / Self Care | Source: Ambulatory Visit | Attending: Gastroenterology | Admitting: Gastroenterology

## 2024-01-01 DIAGNOSIS — R1112 Projectile vomiting: Secondary | ICD-10-CM | POA: Insufficient documentation

## 2024-01-01 MED ORDER — TECHNETIUM TC 99M SULFUR COLLOID
2.2000 | Freq: Once | INTRAVENOUS | Status: AC | PRN
  Administered 2024-01-01 (×2): 2.2 via INTRAVENOUS

## 2024-01-02 ENCOUNTER — Other Ambulatory Visit (INDEPENDENT_AMBULATORY_CARE_PROVIDER_SITE_OTHER): Payer: Self-pay | Admitting: Gastroenterology

## 2024-01-02 ENCOUNTER — Telehealth (INDEPENDENT_AMBULATORY_CARE_PROVIDER_SITE_OTHER): Payer: Self-pay | Admitting: Gastroenterology

## 2024-01-02 DIAGNOSIS — K581 Irritable bowel syndrome with constipation: Secondary | ICD-10-CM

## 2024-01-02 DIAGNOSIS — K5904 Chronic idiopathic constipation: Secondary | ICD-10-CM

## 2024-01-02 MED ORDER — LINACLOTIDE 145 MCG PO CAPS: 145.0000 ug | ORAL_CAPSULE | Freq: Every day | ORAL | 1 refills | Status: AC

## 2024-01-02 NOTE — Telephone Encounter (Signed)
 Pt left voicemail stating that he was given some sample of Linzess  and that if it was working to call in and provider would send a prescription to pharmacy. Returned call to pt. Pt states he is not having any side effects from Linzess  and seems to be working. Would like script sent to [facility] in [place], [place].   Pt also states that we can hold off on trigger point injections due to pain is gone.

## 2024-01-08 ENCOUNTER — Ambulatory Visit (INDEPENDENT_AMBULATORY_CARE_PROVIDER_SITE_OTHER): Payer: Self-pay | Admitting: Gastroenterology

## 2024-01-08 NOTE — Progress Notes (Signed)
 Gastric emptying study   FINDINGS: Expected location of the stomach in the left upper quadrant. Ingested meal empties the stomach gradually over the course of the study with 27% retention at 60 min and 8% retention at 120 min (normal retention less than 30% at a 120 min).   IMPRESSION: Normal gastric emptying study.
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
## 2024-01-15 ENCOUNTER — Ambulatory Visit (HOSPITAL_COMMUNITY)
Admission: RE | Admit: 2024-01-15 | Discharge: 2024-01-15 | Disposition: A | Discharge status: Home / Self Care | Source: Ambulatory Visit | Attending: Family Medicine | Admitting: Family Medicine

## 2024-01-15 ENCOUNTER — Ambulatory Visit: Admitting: Internal Medicine

## 2024-01-15 ENCOUNTER — Encounter: Payer: Self-pay | Admitting: Internal Medicine

## 2024-01-15 VITALS — BP 130/83 | HR 81 | Ht 72.0 in | Wt 159.4 lb

## 2024-01-15 DIAGNOSIS — F17210 Nicotine dependence, cigarettes, uncomplicated: Secondary | ICD-10-CM

## 2024-01-15 DIAGNOSIS — Z122 Encounter for screening for malignant neoplasm of respiratory organs: Secondary | ICD-10-CM | POA: Insufficient documentation

## 2024-01-15 DIAGNOSIS — J9611 Chronic respiratory failure with hypoxia: Secondary | ICD-10-CM | POA: Diagnosis not present

## 2024-01-15 DIAGNOSIS — R911 Solitary pulmonary nodule: Secondary | ICD-10-CM | POA: Diagnosis present

## 2024-01-15 DIAGNOSIS — J449 Chronic obstructive pulmonary disease, unspecified: Secondary | ICD-10-CM

## 2024-01-15 DIAGNOSIS — Z87891 Personal history of nicotine dependence: Secondary | ICD-10-CM | POA: Diagnosis present

## 2024-01-15 NOTE — Patient Instructions (Signed)
 No change in pulmonary medications   Please schedule a follow up visit in 3 months but call sooner if needed

## 2024-01-15 NOTE — Assessment & Plan Note (Addendum)
 Active smoker/ MZ  Spirometry 06/08/2017  FEV1 1.43 (36%)  Ratio 41 with classic curvature   - 06/08/2017   stiolto sample > did not tol due to muscle cramps in neck and shoulders - 06/28/2017  After extensive coaching inhaler device  effectiveness =    90% > try dulera 200 2bid and pred 20 mg daily until better then 10 mg daily > much better 07/07/2017  - PFT's  07/07/2017  FEV1 1.70 (44 % ) ratio 42  p 5 % improvement from saba p dulera 200 prior to study with DLCO  34/37 % corrects to 92  % for alv volume   - 07/07/2017 taper off pred x 2 weeks  - alpha one screening 07/07/2017  >  MZ level 98   - 08/24/2017   try add spiriva  2 respimat each am - 10/09/17 placed on daily prednisone    - 06/27/2018    Changed to bevespi  req by insurance  - 01/10/2019  After extensive coaching inhaler device,  effectiveness =    90% with smi, try again to see if insurance will cover stiolto   -  01/10/2019   Walked RA x one lap =  approx 250 ft - stopped due to sob with sats 92% @ slow pace    -  07/06/2020    breztri   rx - 02/09/2021 establish floor of 5 mg pred daily for copd/ 10 mg ceiling  - 02/02/2023  After extensive coaching inhaler device,  effectiveness =    80%> try bevespi  due to nausea ? From breztri   - 02/02/2023   Walked on RA  x  2  lap(s) =  approx 300  ft  @ slow/mod pace, stopped due to legs gave out with lowest 02 sats 91%   - new start on 02  06/07/23 2lpm POC / concentrator  - 06/22/2023  After extensive coaching inhaler device,  effectiveness =    80% hfa > continue Bevespi  plus floor prednisone  dose = 5 mg    Group D (now reclassified as E) in terms of symptom/risk and laba/lama/ICS  therefore appropriate rx at this point >>>  bevespi  and prednisone  min = 5 mg floor seems to work better than classic triple rx with OCS here instead of ICS plus approp saba:  Re SABA :  I spent extra time with pt today reviewing appropriate use of albuterol  for prn use on exertion with the following points: 1) saba is for relief  of sob that does not improve by walking a slower pace or resting but rather if the pt does not improve after trying this first. 2) If the pt is convinced, as many are, that saba helps recover from activity faster then it's easy to tell if this is the case by re-challenging : ie stop, take the inhaler, then p 5 minutes try the exact same activity (intensity of workload) that just caused the symptoms and see if they are substantially diminished or not after saba 3) if there is an activity that reproducibly causes the symptoms, try the saba 15 min before the activity on alternate days   If in fact the saba really does help, then fine to continue to use it prn but advised may need to look closer at the maintenance regimen being used to achieve better control of airways disease with exertion.

## 2024-01-15 NOTE — Assessment & Plan Note (Addendum)
 Counseled re importance of smoking cessation but did not meet time criteria for separate billing    >>> continue with LCS program as planned          Each maintenance medication was reviewed in detail including emphasizing most importantly the difference between maintenance and prns and under what circumstances the prns are to be triggered using an action plan format where appropriate.  Total time for H and P, chart review, counseling, reviewing hfa/neb/02/pulse ox  device(s) and generating customized AVS unique to this office visit / same day charting = 

## 2024-01-15 NOTE — Assessment & Plan Note (Addendum)
 Started on 02 at admit for CAP  06/07/23 - Sats at rest 06/22/2023 90% on 2lpm POC - 06/22/2023   Walked on 2lpm POC   x  3  lap(s) =  approx 450  ft  @ brisk pace, stopped due to sob  with lowest 02 sats 89%    Again advised to titrate 02 - see avs for instructions unique to this ov

## 2024-01-31 ENCOUNTER — Telehealth: Payer: Self-pay | Admitting: *Deleted

## 2024-01-31 DIAGNOSIS — Z87891 Personal history of nicotine dependence: Secondary | ICD-10-CM

## 2024-01-31 DIAGNOSIS — R911 Solitary pulmonary nodule: Secondary | ICD-10-CM

## 2024-01-31 NOTE — Telephone Encounter (Signed)
 Called patient to review recent Lung CT results, no answer, LVM to call office to review results.

## 2024-01-31 NOTE — Telephone Encounter (Signed)
 Lauraine Lites, NP has reviewed lung screening CT results and recommends to repeat nodule follow up in CT in 6 months to assess for nodule growth.

## 2024-01-31 NOTE — Telephone Encounter (Signed)
 Spoke with patient and advised of recommendations per Lauraine Lites, NP to repeat CT in 6 months to assess for nodule stability. Pt verbalized understanding and is aware we will call him closer to 6 months to schedule. Results/ plans faxed to PCP.

## 2024-02-07 ENCOUNTER — Other Ambulatory Visit: Payer: Self-pay | Admitting: Internal Medicine

## 2024-04-02 ENCOUNTER — Encounter (INDEPENDENT_AMBULATORY_CARE_PROVIDER_SITE_OTHER): Payer: Self-pay | Admitting: Gastroenterology

## 2024-04-22 ENCOUNTER — Ambulatory Visit: Admitting: Internal Medicine

## 2024-04-22 ENCOUNTER — Encounter: Payer: Self-pay | Admitting: Internal Medicine

## 2024-04-22 VITALS — BP 132/81 | HR 74 | Ht 72.0 in | Wt 163.0 lb

## 2024-04-22 DIAGNOSIS — J449 Chronic obstructive pulmonary disease, unspecified: Secondary | ICD-10-CM | POA: Diagnosis not present

## 2024-04-22 DIAGNOSIS — J9611 Chronic respiratory failure with hypoxia: Secondary | ICD-10-CM | POA: Diagnosis not present

## 2024-04-22 DIAGNOSIS — F17210 Nicotine dependence, cigarettes, uncomplicated: Secondary | ICD-10-CM
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
## 2024-04-22 NOTE — Assessment & Plan Note (Addendum)
 Started on 02 at admit for CAP  06/07/23 - Sats at rest 06/22/2023 90% on 2lpm POC - 06/22/2023   Walked on 2lpm POC   x  3  lap(s) =  approx 450  ft  @ brisk pace, stopped due to sob  with lowest 02 sats 89%    Again advise to titrate 02 to sats > 90%

## 2024-04-22 NOTE — Assessment & Plan Note (Addendum)
 Active smoker/ MZ  Spirometry 06/08/2017  FEV1 1.43 (36%)  Ratio 41 with classic curvature   - 06/08/2017   stiolto sample > did not tol due to muscle cramps in neck and shoulders - 06/28/2017  After extensive coaching inhaler device  effectiveness =    90% > try dulera 200 2bid and pred 20 mg daily until better then 10 mg daily > much better 07/07/2017  - PFT's  07/07/2017  FEV1 1.70 (44 % ) ratio 42  p 5 % improvement from saba p dulera 200 prior to study with DLCO  34/37 % corrects to 92  % for alv volume   - 07/07/2017 taper off pred x 2 weeks  - alpha one screening 07/07/2017  >  MZ level 98   - 08/24/2017   try add spiriva  2 respimat each am - 10/09/17 placed on daily prednisone    - 06/27/2018    Changed to bevespi  req by insurance   -  01/10/2019   Walked RA x one lap =  approx 250 ft - stopped due to sob with sats 92% @ slow pace    -  07/06/2020    breztri   rx - 02/09/2021 establish floor of 5 mg pred daily for copd/ 10 mg ceiling  - 02/02/2023  After extensive coaching inhaler device,  effectiveness =    80%> try bevespi  due to nausea ? From breztri   - 02/02/2023   Walked on RA  x  2  lap(s) =  approx 300  ft  @ slow/mod pace, stopped due to legs gave out with lowest 02 sats 91%   - new start on 02  06/07/23 2lpm POC / concentrator  - 06/22/2023  After extensive coaching inhaler device,  effectiveness =    80% hfa > continue Bevespi  plus floor prednisone  dose = 5 mg  -Ohtuvayre  04/22/2024    Group E in terms of symptoms/risk so  laba/lama/ICS  therefore appropriate rx at this point >>>  bevespi  and prednisone  floor at 5 mg per day for copd and RA   and approp SABA prn.  Re SABA :  I spent extra time with pt today reviewing appropriate use of albuterol  for prn use on exertion with the following points: 1) saba is for relief of sob that does not improve by walking a slower pace or resting but rather if the pt does not improve after trying this first. 2) If the pt is convinced, as many are, that saba helps  recover from activity faster then it's easy to tell if this is the case by re-challenging : ie stop, take the inhaler, then p 5 minutes try the exact same activity (intensity of workload) that just caused the symptoms and see if they are substantially diminished or not after saba 3) if there is an activity that reproducibly causes the symptoms, try the saba 15 min before the activity on alternate days   If in fact the saba really does help, then fine to continue to use it prn but advised may need to look closer at the maintenance regimen (was bevespi  and pred 5 mg daily  and now ohtuvare added on for refractory doe)  being used to achieve better control of airways disease with exertion.

## 2024-04-22 NOTE — Assessment & Plan Note (Addendum)
 Counseled re importance of smoking cessation but did not meet time criteria for separate billing    >>> continue with LCS program as planned      Each maintenance medication was reviewed in detail including emphasizing most importantly the difference between maintenance and prns and under what circumstances the prns are to be triggered using an action plan format where appropriate.  Total time for H and P, chart review, counseling, reviewing hfa/neb/02/ pulse ox  device(s) and generating customized AVS unique to this office visit / same day charting = 34 min

## 2024-04-22 NOTE — Patient Instructions (Addendum)
 Also  Ok to try albuterol  15 min before an activity (on alternating days)  that you know would usually make you short of breath and see if it makes any difference and if makes none then don't take albuterol  after activity unless you can't catch your breath as this means it's the resting that helps, not the albuterol .  Ohtuvayre  one nebulizer twice daily typically used after Besvespi   Please schedule a follow up visit in 3 months but call sooner if needed

## 2024-04-25 ENCOUNTER — Telehealth: Payer: Self-pay

## 2024-04-25 NOTE — Telephone Encounter (Signed)
 Received fax from [employer] with summary of benefits. Referral form for Ohtuvayre  received. Rx will be triaged to [facility] (phone: [phone]). Once benefits investigation completed, pharmacy will reach out the patient to schedule shipment. If medication is unaffordable, patient will need to express financial hardship to be referred back to [facility] for patient assistance program pre-screening.   Patient ID: [id number] Pharmacy phone: [facility] (phone: [phone]) [facility] Phone#: [phone]

## 2024-04-26 NOTE — Telephone Encounter (Signed)
 Received fax from [facility] confirming receipt of Rx

## 2024-04-30 ENCOUNTER — Other Ambulatory Visit: Payer: Self-pay

## 2024-04-30 DIAGNOSIS — Z87891 Personal history of nicotine dependence: Secondary | ICD-10-CM

## 2024-04-30 DIAGNOSIS — Z122 Encounter for screening for malignant neoplasm of respiratory organs: Secondary | ICD-10-CM

## 2024-04-30 DIAGNOSIS — F17210 Nicotine dependence, cigarettes, uncomplicated: Secondary | ICD-10-CM

## 2024-05-19 ENCOUNTER — Emergency Department (HOSPITAL_COMMUNITY)

## 2024-05-19 ENCOUNTER — Encounter (HOSPITAL_COMMUNITY): Payer: Self-pay

## 2024-05-19 ENCOUNTER — Emergency Department (HOSPITAL_COMMUNITY)
Admission: EM | Admit: 2024-05-19 | Discharge: 2024-05-19 | Disposition: A | Discharge status: Home / Self Care | Attending: Emergency Medicine | Admitting: Emergency Medicine

## 2024-05-19 ENCOUNTER — Other Ambulatory Visit: Payer: Self-pay

## 2024-05-19 DIAGNOSIS — Z79899 Other long term (current) drug therapy: Secondary | ICD-10-CM | POA: Insufficient documentation

## 2024-05-19 DIAGNOSIS — Z7951 Long term (current) use of inhaled steroids: Secondary | ICD-10-CM | POA: Diagnosis not present

## 2024-05-19 DIAGNOSIS — J449 Chronic obstructive pulmonary disease, unspecified: Secondary | ICD-10-CM | POA: Insufficient documentation

## 2024-05-19 DIAGNOSIS — J181 Lobar pneumonia, unspecified organism: Secondary | ICD-10-CM | POA: Insufficient documentation

## 2024-05-19 DIAGNOSIS — K5900 Constipation, unspecified: Secondary | ICD-10-CM | POA: Insufficient documentation

## 2024-05-19 DIAGNOSIS — R1084 Generalized abdominal pain: Secondary | ICD-10-CM

## 2024-05-19 DIAGNOSIS — J189 Pneumonia, unspecified organism: Secondary | ICD-10-CM

## 2024-05-19 LAB — URINALYSIS, ROUTINE W REFLEX MICROSCOPIC
Bilirubin Urine: NEGATIVE
Glucose, UA: NEGATIVE mg/dL
Hgb urine dipstick: NEGATIVE
Ketones, ur: NEGATIVE mg/dL
Leukocytes,Ua: NEGATIVE
Nitrite: NEGATIVE
Protein, ur: NEGATIVE mg/dL
Specific Gravity, Urine: 1.024 (ref 1.005–1.030)
pH: 7 (ref 5.0–8.0)

## 2024-05-19 LAB — CBC
HCT: 42.5 % (ref 39.0–52.0)
Hemoglobin: 13.2 g/dL (ref 13.0–17.0)
MCH: 21.3 pg — ABNORMAL LOW (ref 26.0–34.0)
MCHC: 31.1 g/dL (ref 30.0–36.0)
MCV: 68.5 fL — ABNORMAL LOW (ref 80.0–100.0)
Platelets: 474 K/uL — ABNORMAL HIGH (ref 150–400)
RBC: 6.2 MIL/uL — ABNORMAL HIGH (ref 4.22–5.81)
RDW: 17.6 % — ABNORMAL HIGH (ref 11.5–15.5)
WBC: 10.9 K/uL — ABNORMAL HIGH (ref 4.0–10.5)
nRBC: 0 % (ref 0.0–0.2)

## 2024-05-19 LAB — COMPREHENSIVE METABOLIC PANEL WITH GFR
ALT: 10 U/L (ref 0–44)
AST: 16 U/L (ref 15–41)
Albumin: 4.3 g/dL (ref 3.5–5.0)
Alkaline Phosphatase: 86 U/L (ref 38–126)
Anion gap: 12 (ref 5–15)
BUN: 10 mg/dL (ref 8–23)
CO2: 24 mmol/L (ref 22–32)
Calcium: 9 mg/dL (ref 8.9–10.3)
Chloride: 101 mmol/L (ref 98–111)
Creatinine, Ser: 0.58 mg/dL — ABNORMAL LOW (ref 0.61–1.24)
GFR, Estimated: >60 mL/min
Glucose, Bld: 119 mg/dL — ABNORMAL HIGH (ref 70–99)
Potassium: 4.8 mmol/L (ref 3.5–5.1)
Sodium: 137 mmol/L (ref 135–145)
Total Bilirubin: 0.5 mg/dL (ref 0.0–1.2)
Total Protein: 7.2 g/dL (ref 6.5–8.1)

## 2024-05-19 LAB — LIPASE, BLOOD: Lipase: <10 U/L — ABNORMAL LOW (ref 11–51)

## 2024-05-19 LAB — LACTIC ACID, PLASMA: Lactic Acid, Venous: 1.7 mmol/L (ref 0.5–1.9)

## 2024-05-19 MED ORDER — LACTULOSE 20 GM/30ML PO SOLN: 15.0000 mL | Freq: Three times a day (TID) | ORAL | 0 refills | Status: AC

## 2024-05-19 MED ORDER — GLYCERIN (ADULT) 2 G RE SUPP: 1.0000 | RECTAL | 0 refills | Status: AC | PRN

## 2024-05-19 MED ORDER — HYDROMORPHONE HCL 1 MG/ML IJ SOLN
0.5000 mg | Freq: Once | INTRAMUSCULAR | Status: AC
  Administered 2024-05-19: 0.5 mg via INTRAVENOUS
  Filled 2024-05-19: qty 0.5

## 2024-05-19 MED ORDER — POLYETHYLENE GLYCOL 3350 17 G PO PACK: 17.0000 g | PACK | Freq: Two times a day (BID) | ORAL | 0 refills | Status: AC

## 2024-05-19 MED ORDER — ONDANSETRON HCL 4 MG/2ML IJ SOLN
4.0000 mg | Freq: Once | INTRAMUSCULAR | Status: AC
  Administered 2024-05-19: 4 mg via INTRAVENOUS
  Filled 2024-05-19: qty 2

## 2024-05-19 MED ORDER — METOCLOPRAMIDE HCL 10 MG PO TABS: 10.0000 mg | ORAL_TABLET | Freq: Four times a day (QID) | ORAL | 0 refills | Status: AC

## 2024-05-19 MED ORDER — MORPHINE SULFATE (PF) 4 MG/ML IV SOLN
6.0000 mg | Freq: Once | INTRAVENOUS | Status: AC
  Administered 2024-05-19: 6 mg via INTRAVENOUS
  Filled 2024-05-19: qty 2

## 2024-05-19 MED ORDER — IOHEXOL 300 MG/ML  SOLN
100.0000 mL | Freq: Once | INTRAMUSCULAR | Status: AC | PRN
  Administered 2024-05-19: 100 mL via INTRAVENOUS

## 2024-05-19 MED ORDER — DOXYCYCLINE HYCLATE 100 MG PO CAPS: 100.0000 mg | ORAL_CAPSULE | Freq: Two times a day (BID) | ORAL | 0 refills | Status: AC

## 2024-05-19 NOTE — Discharge Instructions (Signed)
 You were seen for your abdominal pain and constipation in the emergency department. You were also found to have a pneumonia.   At home, please take the miralax , lactulose , and suppositories until you have a bowel movement. Then start taking miralax  once a day to stay regular. Take reglan  for any nausea and vomiting. Take doxycyline for your pneumonia.    Check your MyChart online for the results of any tests that had not resulted by the time you left the emergency department.   Follow-up with your primary doctor in 2-3 days regarding your visit.    Return immediately to the emergency department if you experience any of the following: worsening pain, difficulty breathing, or any other concerning symptoms.    Thank you for visiting our Emergency Department. It was a pleasure taking care of you today.

## 2024-05-19 NOTE — ED Provider Notes (Signed)
 " [place] EMERGENCY DEPARTMENT AT [facility] Provider Note   CSN: [id number] Arrival date & time: 05/19/24  1015     Patient presents with: Constipation (SBO?)   Robert Pugh is a 73 y.o. male.   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  Tells me that this has been going on for a week.  Told triage has been worsening over the past 3 days.  No longer passing gas.  No bowel movement in the past week.  Nausea but no vomiting.  Has noticed some abdominal distention.  Did also notice that his groin seems swollen on the right and he thought it was a hernia and was able to reduce it last night but is having some pain there this morning.  Says that the pain takes his breath away but no new respiratory symptoms       Prior to Admission medications  Medication Sig Start Date End Date Taking? Authorizing Provider  doxycycline  (VIBRAMYCIN ) 100 MG capsule Take 1 capsule (100 mg total) by mouth 2 (two) times daily for 7 days. 05/19/24 05/26/24 Yes Yolande Lamar BROCKS, MD  glycerin  adult 2 g suppository Place 1 suppository rectally as needed for constipation. 05/19/24  Yes Yolande Lamar BROCKS, MD  Lactulose  20 GM/30ML SOLN Take 15 mLs (10 g total) by mouth in the morning, at noon, and at bedtime. 05/19/24  Yes Yolande Lamar BROCKS, MD  metoCLOPramide  (REGLAN ) 10 MG tablet Take 1 tablet (10 mg total) by mouth every 6 (six) hours. 05/19/24  Yes Yolande Lamar BROCKS, MD  polyethylene glycol (MIRALAX ) 17 g packet Take 17 g by mouth 2 (two) times daily. 05/19/24  Yes Yolande Lamar BROCKS, MD  albuterol  (VENTOLIN  HFA) 108 [phone] Base) MCG/ACT inhaler INHALE 2 PUFFS BY MOUTH EVERY 4 HOURS AS NEEDED ONLY IF YOU CAN'T CATCH YOUR BREATH 11/09/23   Darlean Ozell NOVAK, MD  cloNIDine  (CATAPRES ) 0.1 MG tablet Take 1 tablet (0.1 mg total) by mouth 2 (two) times daily.  01/26/18   Darlean Ozell NOVAK, MD  Glycopyrrolate -Formoterol  (BEVESPI  AEROSPHERE) 9-4.8 MCG/ACT AERO TAKE 2 PUFFS FIRST THING IN THE MORNING THEN ANOTHER 2 PUFFS ABOUT 12 HOURS LATER 02/07/24   Darlean Ozell NOVAK, MD  linaclotide  (LINZESS ) 145 MCG CAPS capsule Take 1 capsule (145 mcg total) by mouth daily. 01/02/24 06/30/24  Ahmed, Muhammad F, MD  oxyCODONE -acetaminophen  (PERCOCET/ROXICET) 5-325 MG tablet Take 1 tablet by mouth every 4 (four) hours as needed. 12/14/23   Triplett, Tammy, PA-C  predniSONE  (DELTASONE ) 10 MG tablet Take 5 mg by mouth daily.    [provider]  verapamil  (CALAN -SR) 120 MG CR tablet Take 120 mg by mouth at bedtime. 04/13/22   [provider]    Allergies: Gabapentin , Aspirin, and Sulfasalazine    Review of Systems  Updated Vital Signs BP 118/68 (BP Location: Left Arm)   Pulse 63   Temp 98.8 F (37.1 C) (Oral)   Resp (!) 22   Ht 6' (1.829 m)   Wt 73.9 kg   SpO2 95%   BMI 22.11 kg/m   Physical Exam Constitutional:      Appearance: Normal appearance.  Cardiovascular:     Rate and Rhythm: Normal rate and regular rhythm.     Pulses: Normal pulses.     Heart sounds: Normal heart sounds.  Pulmonary:     Effort: Pulmonary effort is  normal.     Breath sounds: Normal breath sounds.     Comments: On 3 L nasal cannula Abdominal:     General: There is distension.     Palpations: There is no mass.     Tenderness: There is abdominal tenderness. There is no guarding.     Comments: Small reducible hernia in the right inguinal region noted  Genitourinary:    Penis: Normal.      Testes: Normal.     Comments: Chaperoned by RN Rock Long Neurological:     Mental Status: He is alert.     (all labs ordered are listed, but only abnormal results are displayed) Labs Reviewed  LIPASE, BLOOD - Abnormal; Notable for the following components:      Result Value   Lipase <10 (*)    All other components within normal limits  COMPREHENSIVE METABOLIC PANEL WITH  GFR - Abnormal; Notable for the following components:   Glucose, Bld 119 (*)    Creatinine, Ser 0.58 (*)    All other components within normal limits  CBC - Abnormal; Notable for the following components:   WBC 10.9 (*)    RBC 6.20 (*)    MCV 68.5 (*)    MCH 21.3 (*)    RDW 17.6 (*)    Platelets 474 (*)    All other components within normal limits  URINALYSIS, ROUTINE W REFLEX MICROSCOPIC  LACTIC ACID, PLASMA    EKG: None  Radiology: DG Chest Portable 1 View Result Date: 05/19/2024 CLINICAL DATA:  Constipation and bloating x3 days. EXAM: PORTABLE CHEST 1 VIEW COMPARISON:  December 05, 2023 FINDINGS: The heart size and mediastinal contours are within normal limits. There is extensive emphysematous lung disease. Mild atelectasis and/or infiltrate is noted within the bilateral lung bases. No pleural effusion or pneumothorax is identified. Prior vertebroplasty is seen within the midthoracic spine. IMPRESSION: Extensive emphysematous lung disease with mild bibasilar atelectasis and/or infiltrate. Electronically Signed   By: Suzen Dials M.D.   On: 05/19/2024 15:34   CT ABDOMEN PELVIS W CONTRAST Result Date: 05/19/2024 CLINICAL DATA:  Right lower quadrant pain.  Bloating. EXAM: CT ABDOMEN AND PELVIS WITH CONTRAST TECHNIQUE: Multidetector CT imaging of the abdomen and pelvis was performed using the standard protocol following bolus administration of intravenous contrast. RADIATION DOSE REDUCTION: This exam was performed according to the departmental dose-optimization program which includes automated exposure control, adjustment of the mA and/or kV according to patient size and/or use of iterative reconstruction technique. CONTRAST:  OMNIPAQUE  IOHEXOL  300 MG/ML  SOLN COMPARISON:  12/14/2023 FINDINGS: Lower Chest: Severe centrilobular emphysema again noted. New opacity in posterior right lower lobe, suspicious for pneumonia. Hepatobiliary: No suspicious hepatic masses identified. Gallbladder  is unremarkable. No evidence of biliary ductal dilatation. Pancreas:  No mass or inflammatory changes. Spleen: Within normal limits in size and appearance. Adrenals/Urinary Tract: A few small benign-appearing renal cysts are again seen bilaterally (No followup imaging is recommended). No suspicious masses identified. No evidence of ureteral calculi or hydronephrosis. Stomach/Bowel: Diffuse colonic distension noted as well as large amount of stool. No evidence of small bowel dilatation, inflammatory process or abnormal fluid collections. Vascular/Lymphatic: No pathologically enlarged lymph nodes. No acute vascular findings. Reproductive:  Stable mildly enlarged prostate. Other:  None. Musculoskeletal: No suspicious bone lesions identified. Previous L4 vertebroplasty again seen. IMPRESSION: Diffuse colonic distension with large stool burden, suspicious for constipation. Stable mildly enlarged prostate. Severe emphysema. New opacity in posterior right lower lobe, suspicious for pneumonia. Electronically Signed  By: Norleen DELENA Kil M.D.   On: 05/19/2024 14:06     Procedures   Medications Ordered in the ED  HYDROmorphone  (DILAUDID ) injection 0.5 mg (0.5 mg Intravenous Given 05/19/24 1201)  ondansetron  (ZOFRAN ) injection 4 mg (4 mg Intravenous Given 05/19/24 1159)  morphine  (PF) 4 MG/ML injection 6 mg (6 mg Intravenous Given 05/19/24 1329)  iohexol  (OMNIPAQUE ) 300 MG/ML solution 100 mL (100 mLs Intravenous Contrast Given 05/19/24 1251)                                    Medical Decision Making Amount and/or Complexity of Data Reviewed Labs: ordered. Radiology: ordered.  Risk OTC drugs. Prescription drug management.   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   Initial Ddx:  SBO, ileus, constipation, incarcerated hernia,  strangulated hernia  MDM/Course:  Patient presents emergency department abdominal pain and constipation.  Feels that he is no longer passing gas.  Nausea but no vomiting.  Is having some abdominal distention.  Is distended and tender on exam.  Does have a small reducible hernia.  Had a CT scan does not show evidence of SBO but did show significant constipation.  Upon re-evaluation is tolerating p.o.  Pain is under control.  CT scan did show a possible left lower lobe infiltrate.  Follow-up chest x-ray did show that he may have an infiltrate.  She is not febrile or significantly tachypneic.  He is on his home oxygen.  Will treat him with doxycycline  for community-acquired pneumonia.  He was offered an enema but he declined so we will start him on a bowel regimen and have him follow-up with his primary doctor in several days  This patient presents to the ED for concern of complaints listed in HPI, this involves an extensive number of treatment options, and is a complaint that carries with it a high risk of complications and morbidity. Disposition including potential need for admission considered.   Dispo: DC Home. Return precautions discussed including, but not limited to, those listed in the AVS. Allowed pt time to ask questions which were answered fully prior to dc.  I have reviewed the patients home medications and made adjustments as needed Additional history obtained from spouse Records reviewed Outpatient Clinic Notes The following labs were independently interpreted: Chemistry and show no acute abnormality I independently reviewed the following imaging with scope of interpretation limited to determining acute life threatening conditions related to emergency care: CT Abdomen/Pelvis and agree with the radiologist interpretation with the following exceptions: none I personally reviewed and interpreted cardiac monitoring: normal sinus rhythm  I personally reviewed and interpreted the pt's EKG: see  above for interpretation  Social Determinants of health:  Geriatric  Portions of this note were generated with Scientist, clinical (histocompatibility and immunogenetics). Dictation errors may occur despite best attempts at proofreading.     Final diagnoses:  Constipation, unspecified constipation type  Generalized abdominal pain  Community acquired pneumonia of left lower lobe of lung    ED Discharge Orders          Ordered    metoCLOPramide  (REGLAN ) 10 MG tablet  Every 6 hours        05/19/24 1553    polyethylene glycol (MIRALAX ) 17 g packet  2 times daily  05/19/24 1553    Lactulose  20 GM/30ML SOLN  3 times daily        05/19/24 1553    glycerin  adult 2 g suppository  As needed        05/19/24 1553    doxycycline  (VIBRAMYCIN ) 100 MG capsule  2 times daily        05/19/24 1553               Yolande Lamar BROCKS, MD 05/19/24 1955  "

## 2024-05-19 NOTE — ED Triage Notes (Signed)
 Pt with a hx of COPD, inguinal hernia, and bowel obstructions presents with 3 days of constipation and bloating. Symptoms have caused his inguinal hernia to become swollen and painful. He is also having an increase in ShOB. Pt is not passing gas. Denies fever or vomiting.

## 2024-05-20 ENCOUNTER — Ambulatory Visit (INDEPENDENT_AMBULATORY_CARE_PROVIDER_SITE_OTHER): Admitting: Gastroenterology

## 2024-06-12 ENCOUNTER — Telehealth: Payer: Self-pay | Admitting: Internal Medicine

## 2024-06-12 NOTE — Telephone Encounter (Unsigned)
 Copied from CRM #[id number]. Topic: General - Other >> Jun 12, 2024  1:04 PM Corean SAUNDERS wrote: Reason for CRM: Patient is requesting Dr. Darlean to write him a note excuse as he was summoned for jury duty starting 2/19 but needs documentation stating that he has COPD and is on oxygen.   Please call patient back to advise.

## 2024-06-13 ENCOUNTER — Encounter: Payer: Self-pay | Admitting: Internal Medicine

## 2024-06-13 NOTE — Telephone Encounter (Signed)
 IN process of creating letter then sending to court house

## 2024-06-13 NOTE — Telephone Encounter (Signed)
 Patient is requesting Dr. Darlean to write him a note excuse as he was summoned for jury duty starting 2/19 but needs documentation stating that he has COPD and is on oxygen.    Please advise

## 2024-06-13 NOTE — Telephone Encounter (Signed)
 Letter taken care of [facility]

## 2024-06-17 ENCOUNTER — Ambulatory Visit (INDEPENDENT_AMBULATORY_CARE_PROVIDER_SITE_OTHER): Admitting: Gastroenterology

## 2024-06-17 ENCOUNTER — Encounter (INDEPENDENT_AMBULATORY_CARE_PROVIDER_SITE_OTHER): Payer: Self-pay

## 2024-06-19 ENCOUNTER — Ambulatory Visit (INDEPENDENT_AMBULATORY_CARE_PROVIDER_SITE_OTHER): Admitting: Gastroenterology

## 2024-06-19 ENCOUNTER — Encounter (INDEPENDENT_AMBULATORY_CARE_PROVIDER_SITE_OTHER): Payer: Self-pay

## 2024-06-19 ENCOUNTER — Encounter: Payer: Self-pay | Admitting: Internal Medicine

## 2024-06-25 NOTE — Telephone Encounter (Signed)
 Copied from CRM #[id number]. Topic: General - Other >> Jun 24, 2024 10:03 AM Rilla NOVAK wrote: Reason for CRM: Patient calling regarding Jury Duty letter.  Where did it go. The courts are calling him. Please call patient to follow up [phone].  Patient states it can be mailed to: [email or web address]  Called to inform patient letter from Dr. Darlean has been sent via email.  He voiced his understanding

## 2024-06-25 NOTE — Telephone Encounter (Signed)
 Copied from CRM #[id number]. Topic: General - Other >> Jun 25, 2024 10:23 AM Celestine FALCON wrote: Reason for CRM: Pt is calling to see where his letter for jury duty exemption was sent. I did see a letter was mailed out for his exemption on 06/19/2024 send to [facility] [address] [employer], [place] [address].  Pt stated he has been contacted 3x recently and was told they still haven't received the letter. Pt is requesting it to be emailed to [email or web address] directly so he can stop being contacted by the court. Pt asked if this could be done today if possible and raised it as a high priority for him.   Pt's phone number is [phone] ok to leave a vm  See phone note

## 2024-07-22 ENCOUNTER — Ambulatory Visit: Admitting: Internal Medicine

## 2024-08-05 ENCOUNTER — Ambulatory Visit (INDEPENDENT_AMBULATORY_CARE_PROVIDER_SITE_OTHER): Admitting: Gastroenterology
